# Patient Record
Sex: Female | Born: 1995 | Race: White | Smoking: Never smoker
Health system: Southern US, Community
[De-identification: ages and names within clinical notes are randomized; demographics above are authoritative.]

## PROBLEM LIST (undated history)

## (undated) DIAGNOSIS — L988 Other specified disorders of the skin and subcutaneous tissue: Secondary | ICD-10-CM

## (undated) DIAGNOSIS — E1065 Type 1 diabetes mellitus with hyperglycemia: Secondary | ICD-10-CM

## (undated) DIAGNOSIS — E109 Type 1 diabetes mellitus without complications: Secondary | ICD-10-CM

## (undated) HISTORY — PX: OTHER SURGICAL HISTORY: SHX169

---

## 2015-01-15 DIAGNOSIS — L988 Other specified disorders of the skin and subcutaneous tissue: Secondary | ICD-10-CM

## 2015-01-15 HISTORY — DX: Other specified disorders of the skin and subcutaneous tissue: L98.8

## 2015-05-03 ENCOUNTER — Ambulatory Visit: Payer: Self-pay | Admitting: *Deleted

## 2017-10-30 ENCOUNTER — Encounter (HOSPITAL_COMMUNITY): Payer: Self-pay

## 2017-10-30 ENCOUNTER — Other Ambulatory Visit: Payer: Self-pay

## 2017-10-30 ENCOUNTER — Emergency Department (HOSPITAL_COMMUNITY): Payer: 59

## 2017-10-30 ENCOUNTER — Inpatient Hospital Stay (HOSPITAL_COMMUNITY)
Admission: EM | Admit: 2017-10-30 | Discharge: 2017-11-03 | DRG: 343 | Disposition: A | Discharge status: Home / Self Care | Payer: 59 | Attending: General Surgery | Admitting: General Surgery

## 2017-10-30 DIAGNOSIS — Z8249 Family history of ischemic heart disease and other diseases of the circulatory system: Secondary | ICD-10-CM

## 2017-10-30 DIAGNOSIS — G9340 Encephalopathy, unspecified: Secondary | ICD-10-CM | POA: Diagnosis present

## 2017-10-30 DIAGNOSIS — N898 Other specified noninflammatory disorders of vagina: Secondary | ICD-10-CM | POA: Diagnosis present

## 2017-10-30 DIAGNOSIS — Z794 Long term (current) use of insulin: Secondary | ICD-10-CM | POA: Diagnosis not present

## 2017-10-30 DIAGNOSIS — Z79899 Other long term (current) drug therapy: Secondary | ICD-10-CM

## 2017-10-30 DIAGNOSIS — K3580 Unspecified acute appendicitis: Secondary | ICD-10-CM | POA: Insufficient documentation

## 2017-10-30 DIAGNOSIS — G4700 Insomnia, unspecified: Secondary | ICD-10-CM | POA: Diagnosis present

## 2017-10-30 DIAGNOSIS — E1065 Type 1 diabetes mellitus with hyperglycemia: Secondary | ICD-10-CM | POA: Diagnosis present

## 2017-10-30 DIAGNOSIS — R1031 Right lower quadrant pain: Secondary | ICD-10-CM | POA: Diagnosis present

## 2017-10-30 DIAGNOSIS — E109 Type 1 diabetes mellitus without complications: Secondary | ICD-10-CM | POA: Insufficient documentation

## 2017-10-30 DIAGNOSIS — Z8379 Family history of other diseases of the digestive system: Secondary | ICD-10-CM

## 2017-10-30 HISTORY — DX: Type 1 diabetes mellitus with hyperglycemia: E10.65

## 2017-10-30 HISTORY — DX: Other specified disorders of the skin and subcutaneous tissue: L98.8

## 2017-10-30 HISTORY — DX: Type 1 diabetes mellitus without complications: E10.9

## 2017-10-30 LAB — COMPREHENSIVE METABOLIC PANEL
ALT: 16 U/L (ref 0–44)
AST: 16 U/L (ref 15–41)
Albumin: 4.8 g/dL (ref 3.5–5.0)
Alkaline Phosphatase: 69 U/L (ref 38–126)
Anion gap: 13 (ref 5–15)
BILIRUBIN TOTAL: 1 mg/dL (ref 0.3–1.2)
BUN: 15 mg/dL (ref 6–20)
CHLORIDE: 102 mmol/L (ref 98–111)
CO2: 22 mmol/L (ref 22–32)
CREATININE: 0.78 mg/dL (ref 0.44–1.00)
Calcium: 9.6 mg/dL (ref 8.9–10.3)
Glucose, Bld: 207 mg/dL — ABNORMAL HIGH (ref 70–99)
POTASSIUM: 3.8 mmol/L (ref 3.5–5.1)
Sodium: 137 mmol/L (ref 135–145)
TOTAL PROTEIN: 8.4 g/dL — AB (ref 6.5–8.1)

## 2017-10-30 LAB — CBC WITH DIFFERENTIAL/PLATELET
Abs Immature Granulocytes: 0.01 10*3/uL (ref 0.00–0.07)
Basophils Absolute: 0 10*3/uL (ref 0.0–0.1)
Basophils Relative: 0 %
Eosinophils Absolute: 0 10*3/uL (ref 0.0–0.5)
Eosinophils Relative: 0 %
HCT: 48.2 % — ABNORMAL HIGH (ref 36.0–46.0)
Hemoglobin: 16 g/dL — ABNORMAL HIGH (ref 12.0–15.0)
Immature Granulocytes: 0 %
Lymphocytes Relative: 27 %
Lymphs Abs: 1.7 10*3/uL (ref 0.7–4.0)
MCH: 29.3 pg (ref 26.0–34.0)
MCHC: 33.2 g/dL (ref 30.0–36.0)
MCV: 88.3 fL (ref 80.0–100.0)
Monocytes Absolute: 0.3 10*3/uL (ref 0.1–1.0)
Monocytes Relative: 5 %
Neutro Abs: 4.2 10*3/uL (ref 1.7–7.7)
Neutrophils Relative %: 68 %
Platelets: 271 10*3/uL (ref 150–400)
RBC: 5.46 MIL/uL — ABNORMAL HIGH (ref 3.87–5.11)
RDW: 11.7 % (ref 11.5–15.5)
WBC: 6.2 10*3/uL (ref 4.0–10.5)
nRBC: 0 % (ref 0.0–0.2)

## 2017-10-30 LAB — I-STAT BETA HCG BLOOD, ED (MC, WL, AP ONLY): I-stat hCG, quantitative: <5 m[IU]/mL (ref <5)

## 2017-10-30 LAB — WET PREP, GENITAL
Sperm: NONE SEEN
Trich, Wet Prep: NONE SEEN
Yeast Wet Prep HPF POC: NONE SEEN

## 2017-10-30 LAB — URINALYSIS, ROUTINE W REFLEX MICROSCOPIC
BILIRUBIN URINE: NEGATIVE
Glucose, UA: 150 mg/dL — AB
HGB URINE DIPSTICK: NEGATIVE
Ketones, ur: 80 mg/dL — AB
Leukocytes, UA: NEGATIVE
NITRITE: NEGATIVE
PH: 5 (ref 5.0–8.0)
Protein, ur: 30 mg/dL — AB
SPECIFIC GRAVITY, URINE: 1.034 — AB (ref 1.005–1.030)

## 2017-10-30 LAB — CBG MONITORING, ED
Glucose-Capillary: 186 mg/dL — ABNORMAL HIGH (ref 70–99)
Glucose-Capillary: 109 mg/dL — ABNORMAL HIGH (ref 70–99)

## 2017-10-30 LAB — LIPASE, BLOOD: Lipase: 34 U/L (ref 11–51)

## 2017-10-30 LAB — GLUCOSE, CAPILLARY: Glucose-Capillary: 107 mg/dL — ABNORMAL HIGH (ref 70–99)

## 2017-10-30 MED ORDER — SODIUM CHLORIDE 0.9 % IV BOLUS: 1000.0000 mL | Freq: Three times a day (TID) | INTRAVENOUS | Status: DC | PRN

## 2017-10-30 MED ORDER — DIPHENHYDRAMINE HCL 12.5 MG/5ML PO ELIX
12.5000 mg | ORAL_SOLUTION | Freq: Four times a day (QID) | ORAL | Status: DC | PRN
  Administered 2017-11-01 – 2017-11-02 (×2): 12.5 mg via ORAL
  Filled 2017-10-30 (×2): qty 5

## 2017-10-30 MED ORDER — MORPHINE SULFATE (PF) 4 MG/ML IV SOLN
4.0000 mg | Freq: Once | INTRAVENOUS | Status: AC
  Administered 2017-10-30: 4 mg via INTRAVENOUS
  Filled 2017-10-30: qty 1

## 2017-10-30 MED ORDER — ENOXAPARIN SODIUM 40 MG/0.4ML ~~LOC~~ SOLN
40.0000 mg | Freq: Every day | SUBCUTANEOUS | Status: DC
  Administered 2017-10-30 – 2017-11-02 (×4): 40 mg via SUBCUTANEOUS
  Filled 2017-10-30 (×4): qty 0.4

## 2017-10-30 MED ORDER — SIMETHICONE 80 MG PO CHEW: 40.0000 mg | CHEWABLE_TABLET | Freq: Four times a day (QID) | ORAL | Status: DC | PRN

## 2017-10-30 MED ORDER — HYDROCORTISONE 1 % EX CREA: 1.0000 "application " | TOPICAL_CREAM | Freq: Three times a day (TID) | CUTANEOUS | Status: DC | PRN

## 2017-10-30 MED ORDER — ONDANSETRON 4 MG PO TBDP: 4.0000 mg | ORAL_TABLET | Freq: Four times a day (QID) | ORAL | Status: DC | PRN

## 2017-10-30 MED ORDER — METHOCARBAMOL 1000 MG/10ML IJ SOLN
1000.0000 mg | Freq: Four times a day (QID) | INTRAVENOUS | Status: DC | PRN
  Administered 2017-11-01: 1000 mg via INTRAVENOUS
  Filled 2017-10-30 (×2): qty 10

## 2017-10-30 MED ORDER — ACETAMINOPHEN 500 MG PO TABS: 1000.0000 mg | ORAL_TABLET | ORAL | Status: AC

## 2017-10-30 MED ORDER — MENTHOL 3 MG MT LOZG: 1.0000 | LOZENGE | OROMUCOSAL | Status: DC | PRN

## 2017-10-30 MED ORDER — LACTATED RINGERS IV SOLN
INTRAVENOUS | Status: DC
  Administered 2017-10-30 – 2017-11-03 (×5): via INTRAVENOUS

## 2017-10-30 MED ORDER — IOPAMIDOL (ISOVUE-300) INJECTION 61%
100.0000 mL | Freq: Once | INTRAVENOUS | Status: AC | PRN
  Administered 2017-10-30: 100 mL via INTRAVENOUS

## 2017-10-30 MED ORDER — ESCITALOPRAM OXALATE 10 MG PO TABS
5.0000 mg | ORAL_TABLET | Freq: Every day | ORAL | Status: DC
  Administered 2017-10-30 – 2017-11-03 (×4): 5 mg via ORAL
  Filled 2017-10-30 (×4): qty 1

## 2017-10-30 MED ORDER — ONDANSETRON HCL 4 MG/2ML IJ SOLN
4.0000 mg | Freq: Once | INTRAMUSCULAR | Status: AC
  Administered 2017-10-30: 4 mg via INTRAVENOUS
  Filled 2017-10-30: qty 2

## 2017-10-30 MED ORDER — SODIUM CHLORIDE 0.9 % IJ SOLN
INTRAMUSCULAR | Status: AC
  Filled 2017-10-30: qty 50

## 2017-10-30 MED ORDER — SODIUM CHLORIDE 0.9 % IV SOLN
2.0000 g | INTRAVENOUS | Status: DC
  Administered 2017-10-31: 2 g via INTRAVENOUS
  Filled 2017-10-30 (×2): qty 20
  Filled 2017-10-30: qty 2

## 2017-10-30 MED ORDER — SODIUM CHLORIDE 0.9 % IV BOLUS
1000.0000 mL | Freq: Once | INTRAVENOUS | Status: AC
  Administered 2017-10-30: 1000 mL via INTRAVENOUS

## 2017-10-30 MED ORDER — ONDANSETRON HCL 4 MG/2ML IJ SOLN: 4.0000 mg | Freq: Four times a day (QID) | INTRAMUSCULAR | Status: DC | PRN

## 2017-10-30 MED ORDER — LACTATED RINGERS IV BOLUS
1000.0000 mL | Freq: Once | INTRAVENOUS | Status: AC
  Administered 2017-10-30: 1000 mL via INTRAVENOUS

## 2017-10-30 MED ORDER — DIPHENHYDRAMINE HCL 50 MG/ML IJ SOLN: 12.5000 mg | Freq: Four times a day (QID) | INTRAMUSCULAR | Status: DC | PRN

## 2017-10-30 MED ORDER — INSULIN ASPART 100 UNIT/ML ~~LOC~~ SOLN
0.0000 [IU] | SUBCUTANEOUS | Status: DC
  Administered 2017-10-31: 9 [IU] via SUBCUTANEOUS
  Administered 2017-10-31 – 2017-11-01 (×4): 3 [IU] via SUBCUTANEOUS
  Administered 2017-11-02: 2 [IU] via SUBCUTANEOUS

## 2017-10-30 MED ORDER — GABAPENTIN 300 MG PO CAPS: 300.0000 mg | ORAL_CAPSULE | ORAL | Status: AC

## 2017-10-30 MED ORDER — ALUM & MAG HYDROXIDE-SIMETH 200-200-20 MG/5ML PO SUSP: 30.0000 mL | Freq: Four times a day (QID) | ORAL | Status: DC | PRN

## 2017-10-30 MED ORDER — KETOROLAC TROMETHAMINE 30 MG/ML IJ SOLN
30.0000 mg | Freq: Once | INTRAMUSCULAR | Status: AC
  Administered 2017-10-30: 30 mg via INTRAVENOUS
  Filled 2017-10-30: qty 1

## 2017-10-30 MED ORDER — HYDROCORTISONE 2.5 % RE CREA: 1.0000 "application " | TOPICAL_CREAM | Freq: Four times a day (QID) | RECTAL | Status: DC | PRN

## 2017-10-30 MED ORDER — ACETAMINOPHEN 650 MG RE SUPP: 650.0000 mg | Freq: Four times a day (QID) | RECTAL | Status: DC | PRN

## 2017-10-30 MED ORDER — TRAZODONE HCL 50 MG PO TABS
50.0000 mg | ORAL_TABLET | Freq: Every day | ORAL | Status: DC
  Administered 2017-10-30 – 2017-11-02 (×4): 50 mg via ORAL
  Filled 2017-10-30 (×4): qty 1

## 2017-10-30 MED ORDER — SODIUM CHLORIDE 0.9 % IV SOLN
8.0000 mg | Freq: Four times a day (QID) | INTRAVENOUS | Status: DC | PRN
  Filled 2017-10-30: qty 4

## 2017-10-30 MED ORDER — PROCHLORPERAZINE EDISYLATE 10 MG/2ML IJ SOLN: 5.0000 mg | INTRAMUSCULAR | Status: DC | PRN

## 2017-10-30 MED ORDER — GUAIFENESIN-DM 100-10 MG/5ML PO SYRP: 10.0000 mL | ORAL_SOLUTION | ORAL | Status: DC | PRN

## 2017-10-30 MED ORDER — IOPAMIDOL (ISOVUE-300) INJECTION 61%
INTRAVENOUS | Status: AC
  Filled 2017-10-30: qty 100

## 2017-10-30 MED ORDER — ACETAMINOPHEN 325 MG PO TABS
650.0000 mg | ORAL_TABLET | Freq: Four times a day (QID) | ORAL | Status: DC | PRN
  Administered 2017-11-01 – 2017-11-03 (×5): 650 mg via ORAL
  Filled 2017-10-30 (×5): qty 2

## 2017-10-30 MED ORDER — LIP MEDEX EX OINT
1.0000 "application " | TOPICAL_OINTMENT | Freq: Two times a day (BID) | CUTANEOUS | Status: DC
  Administered 2017-10-30 – 2017-11-02 (×5): 1 via TOPICAL
  Filled 2017-10-30 (×2): qty 7

## 2017-10-30 MED ORDER — ENSURE PRE-SURGERY PO LIQD
296.0000 mL | Freq: Once | ORAL | Status: AC
  Administered 2017-10-31: 296 mL via ORAL
  Filled 2017-10-30: qty 296

## 2017-10-30 MED ORDER — METRONIDAZOLE IN NACL 5-0.79 MG/ML-% IV SOLN
500.0000 mg | Freq: Three times a day (TID) | INTRAVENOUS | Status: DC
  Administered 2017-10-30 – 2017-10-31 (×2): 500 mg via INTRAVENOUS
  Filled 2017-10-30 (×3): qty 100

## 2017-10-30 MED ORDER — MAGIC MOUTHWASH
15.0000 mL | Freq: Four times a day (QID) | ORAL | Status: DC | PRN
  Filled 2017-10-30: qty 15

## 2017-10-30 MED ORDER — HYDROMORPHONE HCL 1 MG/ML IJ SOLN
0.5000 mg | INTRAMUSCULAR | Status: DC | PRN
  Administered 2017-10-31 (×2): 1 mg via INTRAVENOUS
  Administered 2017-10-31 – 2017-11-01 (×3): 2 mg via INTRAVENOUS
  Administered 2017-11-01: 1 mg via INTRAVENOUS
  Administered 2017-11-01: 2 mg via INTRAVENOUS
  Filled 2017-10-30: qty 1
  Filled 2017-10-30 (×2): qty 2
  Filled 2017-10-30 (×2): qty 1
  Filled 2017-10-30 (×2): qty 2

## 2017-10-30 MED ORDER — INSULIN GLARGINE 100 UNIT/ML ~~LOC~~ SOLN
15.0000 [IU] | Freq: Every day | SUBCUTANEOUS | Status: DC
  Administered 2017-10-31 – 2017-11-02 (×3): 15 [IU] via SUBCUTANEOUS
  Filled 2017-10-30 (×5): qty 0.15

## 2017-10-30 MED ORDER — CELECOXIB 200 MG PO CAPS: 200.0000 mg | ORAL_CAPSULE | ORAL | Status: AC

## 2017-10-30 MED ORDER — IOHEXOL 300 MG/ML  SOLN
30.0000 mL | Freq: Once | INTRAMUSCULAR | Status: AC | PRN
  Administered 2017-10-30: 30 mL via ORAL

## 2017-10-30 MED ORDER — PHENOL 1.4 % MT LIQD: 1.0000 | OROMUCOSAL | Status: DC | PRN

## 2017-10-30 NOTE — H&P (Signed)
Kelly Morrow  1995-06-21 [id number]  CARE TEAM:  PCP: Patient, No Pcp Per  Outpatient Care Team: Patient Care Team: Patient, No Pcp Per as PCP - General (General Practice)  Inpatient Treatment Team: Treatment Team: Attending Provider: Gareth Morgan, MD; Registered Nurse: Lupe Carney, RN; Physician Assistant: Debroah Baller; Registered Nurse: Stefanie Libel, RN; Technician: Francoise Schaumann, NT; Consulting Physician: Edison Pace, Md, MD   This patient is a 22 y.o.female who presents today for surgical evaluation at the request of Rodell Perna, [facility] ED.   Chief complaint / Reason for evaluation: Right lower quadrant abdominal pain.  Probable appendicitis.  Pleasant young female.  Is lived in [place] in [place].  Currently in [place].  Developed worsening abdominal pain over a day ago.  Rather diffuse with nausea and decreased appetite.  Became more intense.  Began vomiting.  Pain more intense and right lower side.  Painful to cough or move.  Car ride to the ER uncomfortable.  Examination concerning for appendicitis.  CT scan confirms large appendix.  No definite inflammation seen but extremely thin.  Rest of the differential diagnosis seems unlikely, surgical consultation requested.  She is an insulin requiring diabetic.  Followed by Dr. Buddy Duty.  On subcutaneous insulin.  Sugars tend to run in the 200s.  No personal nor family history of GI/colon cancer, inflammatory bowel disease, irritable bowel syndrome, allergy such as Celiac Sprue, dietary/dairy problems, colitis, ulcers nor gastritis.  No recent sick contacts/gastroenteritis.  No travel outside the country.  No changes in diet.  No dysphagia to solids or liquids.  No significant heartburn or reflux.  No hematochezia, hematemesis, coffee ground emesis.  No evidence of prior gastric/peptic ulceration.  Normally moves her bowels every day.  Does not smoke.  No prior abdominal surgery.  No history of kidney  stones.  No PID.  No vaginal bleeding or discharge.  No pyuria.  No recent urinary tract infection.    Assessment  Kelly Morrow  22 y.o. female       Problem List:  Principal Problem:   Abdominal pain, RLQ (right lower quadrant) Active Problems:   Poorly controlled type 1 diabetes mellitus ([place])   Acute appendicitis by history physical  Plan:  Admit.  IV antibiotics.  IV fluids.  Diabetic control.  Continue QHS long-term insulin.  Do sliding scale insulin for now.  Check A1c.  Diagnostic laparoscopy with probable appendectomy this admission. The anatomy & physiology of the digestive tract was discussed.  The pathophysiology of appendicitis and other appendiceal disorders were discussed.  Natural history risks without surgery was discussed.   I feel the risks of no intervention will lead to serious problems that outweigh the operative risks; therefore, I recommended diagnostic laparoscopy with removal of appendix to remove the pathology.  Laparoscopic & open techniques were discussed.   I noted a good likelihood this will help address the problem.   Risks such as bleeding, infection, abscess, leak, reoperation, injury to other organs, need for repair of tissues / organs, possible ostomy, hernia, heart attack, stroke, death, and other risks were discussed.  Goals of post-operative recovery were discussed as well.  We will work to minimize complications.  Questions were answered.  The patient expresses understanding & wishes to proceed with surgery.   -VTE prophylaxis- SCDs, etc -mobilize as tolerated to help recovery  30 minutes spent in review, evaluation, examination, counseling, and coordination of care.  More than 50% of that time was spent in  counseling.  Adin Hector, MD, FACS, MASCRS Gastrointestinal and Minimally Invasive Surgery  Acute appendicitis  [address], [place], [place] [address] [phone] Main / Paging [phone]  Fax   10/30/2017      Past Medical History:  Diagnosis Date  . Type 1 diabetes mellitus (HCC)    Type 1    Past Surgical History:  Procedure Laterality Date  . pilonidal cyst removal      Social History   Socioeconomic History  . Marital status: Unknown    Spouse name: Not on file  . Number of children: Not on file  . Years of education: Not on file  . Highest education level: Not on file  Occupational History  . Not on file  Social Needs  . Financial resource strain: Not on file  . Food insecurity:    Worry: Not on file    Inability: Not on file  . Transportation needs:    Medical: Not on file    Non-medical: Not on file  Tobacco Use  . Smoking status: Never Smoker  . Smokeless tobacco: Never Used  Substance and Sexual Activity  . Alcohol use: Never    Frequency: Never  . Drug use: Never  . Sexual activity: Not on file  Lifestyle  . Physical activity:    Days per week: Not on file    Minutes per session: Not on file  . Stress: Not on file  Relationships  . Social connections:    Talks on phone: Not on file    Gets together: Not on file    Attends religious service: Not on file    Active member of club or organization: Not on file    Attends meetings of clubs or organizations: Not on file    Relationship status: Not on file  . Intimate partner violence:    Fear of current or ex partner: Not on file    Emotionally abused: Not on file    Physically abused: Not on file    Forced sexual activity: Not on file  Other Topics Concern  . Not on file  Social History Narrative  . Not on file    Family History  Problem Relation Age of Onset  . Celiac disease Mother   . Hypertension Father     Current Facility-Administered Medications  Medication Dose Route Frequency Provider Last Rate Last Dose  . BASAGLAR KWIKPEN KwikPen 15 Units  15 Units Subcutaneous QHS Michael Boston, MD      . escitalopram (LEXAPRO) tablet 5 mg  5 mg Oral Daily Loretha Ure, Remo Lipps, MD       . insulin aspart (novoLOG) injection 0-15 Units  0-15 Units Subcutaneous Q4H Michael Boston, MD      . iopamidol (ISOVUE-300) 61 % injection           . sodium chloride 0.9 % injection           . traZODone (DESYREL) tablet 50 mg  50 mg Oral Ardeen Fillers, MD       Current Outpatient Medications  Medication Sig Dispense Refill  . escitalopram (LEXAPRO) 5 MG tablet Take 1 tablet by mouth daily.    . Insulin Glargine (BASAGLAR KWIKPEN) 100 UNIT/ML SOPN Inject 15 Units into the skin at bedtime.     Marland Kitchen NOVOLOG FLEXPEN 100 UNIT/ML FlexPen Inject 10-15 Units into the skin 3 (three) times daily with meals.     . traZODone (DESYREL) 50 MG tablet Take 1 tablet  by mouth at bedtime.     Marland Kitchen glucagon 1 MG injection Inject 1 mg into the vein once as needed.        No Known Allergies  ROS:   All other systems reviewed & are negative except per HPI or as noted below: Constitutional:  No fevers, chills, sweats.  Weight stable Eyes:  No vision changes, No discharge HENT:  No sore throats, nasal drainage Lymph: No neck swelling, No bruising easily Pulmonary:  No cough, productive sputum CV: No orthopnea, PND  Patient walks 60 minutes for about 2 miles without difficulty.  No exertional chest/neck/shoulder/arm pain. GI: No personal nor family history of GI/colon cancer, inflammatory bowel disease, irritable bowel syndrome, allergy such as Celiac Sprue, dietary/dairy problems, colitis, ulcers nor gastritis.  No recent sick contacts/gastroenteritis.  No travel outside the country.  No changes in diet. Renal: No UTIs, No hematuria Genital:  No drainage, bleeding, masses Musculoskeletal: No severe joint pain.  Good ROM major joints Skin:  No sores or lesions.  No rashes Heme/Lymph:  No easy bleeding.  No swollen lymph nodes Neuro: No focal weakness/numbness.  No seizures Psych: No suicidal ideation.  No hallucinations  BP 132/81 (BP Location: Left Arm)   Pulse 79   Temp 98.2 F (36.8 C)   Resp 14    Ht _0  (1.6 m)   Wt 53.5 kg   LMP 10/23/2017   SpO2 100%   BMI 20.90 kg/m   Physical Exam: General: Pt awake/alert/oriented x4 in mild major acute distress Eyes: PERRL, normal EOM. Sclera nonicteric Neuro: CN II-XII intact w/o focal sensory/motor deficits. Lymph: No head/neck/groin lymphadenopathy Psych:  No delerium/psychosis/paranoia HENT: Normocephalic, Mucus membranes moist.  No thrush Neck: Supple, No tracheal deviation Chest: No pain.  Good respiratory excursion. CV:  Pulses intact.  Regular rhythm  Abdomen: Soft, Nondistended.  Tenderness to palpation right lower quadrant with guarding.  Right over McBurney's point.  Positive psoas sign.  Positive Rosvig sign.  Left lower quadrant upper abdomen otherwise nontender.  Nontender.  No incarcerated hernias.  Gen:  No inguinal hernias.  No inguinal lymphadenopathy.  No vaginal bleeding or discharge.  I did not repeat pelvic examination. Rectal deferred. Ext:  SCDs BLE.  No significant edema.  No cyanosis Skin: No petechiae / purpurea.  No major sores Musculoskeletal: No severe joint pain.  Good ROM major joints   Results:   Labs: Results for orders placed or performed during the hospital encounter of 10/30/17 (from the past 48 hour(s))  Lipase, blood     Status: None   Collection Time: 10/30/17  3:15 PM  Result Value Ref Range   Lipase 34 11 - 51 U/L    Comment: Performed at [facility], [place] [address], [place], [place] [address]  Comprehensive metabolic panel     Status: Abnormal   Collection Time: 10/30/17  3:15 PM  Result Value Ref Range   Sodium 137 135 - 145 mmol/L   Potassium 3.8 3.5 - 5.1 mmol/L   Chloride 102 98 - 111 mmol/L   CO2 22 22 - 32 mmol/L   Glucose, Bld 207 (H) 70 - 99 mg/dL   BUN 15 6 - 20 mg/dL   Creatinine, Ser 0.78 0.44 - 1.00 mg/dL   Calcium 9.6 8.9 - 10.3 mg/dL   Total Protein 8.4 (H) 6.5 - 8.1 g/dL   Albumin 4.8 3.5 - 5.0 g/dL   AST 16 15 - 41 U/L   ALT 16 0 - 44  U/L    Alkaline Phosphatase 69 38 - 126 U/L   Total Bilirubin 1.0 0.3 - 1.2 mg/dL   GFR calc non Af Amer >60 >60 mL/min   GFR calc Af Amer >60 >60 mL/min    Comment: (NOTE) The eGFR has been calculated using the CKD EPI equation. This calculation has not been validated in all clinical situations. eGFR's persistently <60 mL/min signify possible Chronic Kidney Disease.    Anion gap 13 5 - 15    Comment: Performed at [facility], [place] [address] [place], [place] [address]  Urinalysis, Routine w reflex microscopic     Status: Abnormal   Collection Time: 10/30/17  3:15 PM  Result Value Ref Range   Color, Urine AMBER (A) YELLOW    Comment: BIOCHEMICALS MAY BE AFFECTED BY COLOR   APPearance HAZY (A) CLEAR   Specific Gravity, Urine 1.034 (H) 1.005 - 1.030   pH 5.0 5.0 - 8.0   Glucose, UA 150 (A) NEGATIVE mg/dL   Hgb urine dipstick NEGATIVE NEGATIVE   Bilirubin Urine NEGATIVE NEGATIVE   Ketones, ur 80 (A) NEGATIVE mg/dL   Protein, ur 30 (A) NEGATIVE mg/dL   Nitrite NEGATIVE NEGATIVE   Leukocytes, UA NEGATIVE NEGATIVE   RBC / HPF 6-10 0 - 5 RBC/hpf   WBC, UA 0-5 0 - 5 WBC/hpf   Bacteria, UA RARE (A) NONE SEEN   Squamous Epithelial / LPF 0-5 0 - 5   Mucus PRESENT    Hyaline Casts, UA PRESENT     Comment: Performed at [facility], [place] [address], [place], [place] [address]  CBC with Differential     Status: Abnormal   Collection Time: 10/30/17  5:20 PM  Result Value Ref Range   WBC 6.2 4.0 - 10.5 K/uL   RBC 5.46 (H) 3.87 - 5.11 MIL/uL   Hemoglobin 16.0 (H) 12.0 - 15.0 g/dL   HCT 48.2 (H) 36.0 - 46.0 %   MCV 88.3 80.0 - 100.0 fL   MCH 29.3 26.0 - 34.0 pg   MCHC 33.2 30.0 - 36.0 g/dL   RDW 11.7 11.5 - 15.5 %   Platelets 271 150 - 400 K/uL   nRBC 0.0 0.0 - 0.2 %   Neutrophils Relative % 68 %   Neutro Abs 4.2 1.7 - 7.7 K/uL   Lymphocytes Relative 27 %   Lymphs Abs 1.7 0.7 - 4.0 K/uL   Monocytes Relative 5 %   Monocytes Absolute 0.3 0.1 - 1.0  K/uL   Eosinophils Relative 0 %   Eosinophils Absolute 0.0 0.0 - 0.5 K/uL   Basophils Relative 0 %   Basophils Absolute 0.0 0.0 - 0.1 K/uL   Immature Granulocytes 0 %   Abs Immature Granulocytes 0.01 0.00 - 0.07 K/uL    Comment: Performed at [facility], [place] [address], [place], [place] [address]  CBG monitoring, ED     Status: Abnormal   Collection Time: 10/30/17  5:20 PM  Result Value Ref Range   Glucose-Capillary 186 (H) 70 - 99 mg/dL  I-Stat beta hCG blood, ED     Status: None   Collection Time: 10/30/17  5:55 PM  Result Value Ref Range   I-stat hCG, quantitative <5.0 <5 mIU/mL   Comment 3            Comment:   GEST. AGE      CONC.  (mIU/mL)   <=1 WEEK        5 -  50     2 WEEKS       50 - 500     3 WEEKS       100 - 10,000     4 WEEKS     1,000 - 30,000        FEMALE AND NON-PREGNANT FEMALE:     LESS THAN 5 mIU/mL   Wet prep, genital     Status: Abnormal   Collection Time: 10/30/17  6:15 PM  Result Value Ref Range   Yeast Wet Prep HPF POC NONE SEEN NONE SEEN   Trich, Wet Prep NONE SEEN NONE SEEN   Clue Cells Wet Prep HPF POC PRESENT (A) NONE SEEN   WBC, Wet Prep HPF POC FEW (A) NONE SEEN   Sperm NONE SEEN     Comment: Performed at [facility], [place] [address] [place], [place] [address]    Imaging / Studies: Ct Abdomen Pelvis W Contrast  Result Date: 10/30/2017 CLINICAL DATA:  Right lower quadrant pain beginning yesterday. Type 1 diabetes. EXAM: CT ABDOMEN AND PELVIS WITH CONTRAST TECHNIQUE: Multidetector CT imaging of the abdomen and pelvis was performed using the standard protocol following bolus administration of intravenous contrast. CONTRAST:  129m ISOVUE-300 IOPAMIDOL (ISOVUE-300) INJECTION 61%, 375mOMNIPAQUE IOHEXOL 300 MG/ML SOLN COMPARISON:  None. FINDINGS: Lower chest: Normal Hepatobiliary: Normal Pancreas: Normal Spleen: Normal Adrenals/Urinary Tract: Adrenal glands are normal. Kidneys are normal. Bladder is normal.  Stomach/Bowel: The appendix is more prominent than usual, measuring up to 1 cm in diameter. There is not any convincing inflammatory change around the fat however. I note that the white count is also normal. Given the presence of right lower quadrant pain and appendix of this size, this could be a case of early appendicitis. I do not think the diagnosis is definite however. Vascular/Lymphatic: Normal Reproductive: Normal Other: None Musculoskeletal: Normal IMPRESSION: The appendix is more prominent than usual, measuring up to 1 cm in size. It does not fill with contrast. There is not any Jaylnn Ullery surrounding inflammatory change however. The patient does have right lower quadrant pain. The white count is normal. This case remains indeterminate by imaging, and the possibility of very early appendicitis is not excluded. At the least, clinical follow-up is suggested. Electronically Signed   By: MaNelson Chimes.D.   On: 10/30/2017 20:15    Medications / Allergies: per chart  Antibiotics: Anti-infectives (From admission, onward)   None        Note: Portions of this report may have been transcribed using voice recognition software. Every effort was made to ensure accuracy; however, inadvertent computerized transcription errors may be present.   Any transcriptional errors that result from this process are unintentional.    StAdin HectorMD, FACS, MASCRS Gastrointestinal and Minimally Invasive Surgery    [address] [place] [phone] / Paging [phone]   10/30/2017

## 2017-10-30 NOTE — ED Provider Notes (Signed)
[facility]-EMERGENCY DEPT Provider Note   CSN: [id number] Arrival date & time: 10/30/17  1456     History   Chief Complaint Chief Complaint  Patient presents with  . Abdominal Pain    HPI Kelly Morrow is a 22 y.o. female with history of type 1 diabetes mellitus presents for evaluation of acute onset, progressively worsening abdominal pain since yesterday.  Pain began at around 5:30 PM in the periumbilical region and has since radiated to the right lower quadrant.  Pain is constant, throbbing, at times sharp and will radiate "all over ".  Pain will worsen with bending and palpation.  She denies fevers or chills.  She endorses nausea and has had 2 episodes of nonbloody nonbilious emesis.  Attempted to take ibuprofen but vomited it back up.  Has not been able to tolerate p.o. intake since yesterday.  Denies urinary symptoms.  No vaginal itching, bleeding, or discharge.  Has had multiple episodes of watery nonbloody diarrhea.  Denies recent treatment with antibiotics, recent travel, or suspicious food intake.  The history is provided by the patient.    Past Medical History:  Diagnosis Date  . Pilonidal disease 2017  . Poorly controlled type 1 diabetes mellitus (HCC) 10/30/2017  . Type 1 diabetes mellitus (HCC)    Type 1    Patient Active Problem List   Diagnosis Date Noted  . Poorly controlled type 1 diabetes mellitus (HCC) 10/30/2017  . Acute appendicitis 10/30/2017  . Insomnia 10/30/2017    Past Surgical History:  Procedure Laterality Date  . pilonidal cyst removal       OB History   None      Home Medications    Prior to Admission medications   Medication Sig Start Date End Date Taking? Authorizing Provider  escitalopram (LEXAPRO) 5 MG tablet Take 1 tablet by mouth daily. 07/29/17  Yes [provider]  Insulin Glargine (BASAGLAR KWIKPEN) 100 UNIT/ML SOPN Inject 15 Units into the skin at bedtime.  10/05/17  Yes [provider]   NOVOLOG FLEXPEN 100 UNIT/ML FlexPen Inject 10-15 Units into the skin 3 (three) times daily with meals.  08/24/17  Yes [provider]  traZODone (DESYREL) 50 MG tablet Take 1 tablet by mouth at bedtime.  07/29/17  Yes [provider]  glucagon 1 MG injection Inject 1 mg into the vein once as needed.     [provider]    Family History Family History  Problem Relation Age of Onset  . Celiac disease Mother   . Hypertension Father     Social History Social History   Tobacco Use  . Smoking status: Never Smoker  . Smokeless tobacco: Never Used  Substance Use Topics  . Alcohol use: Never    Frequency: Never  . Drug use: Never     Allergies   Patient has no known allergies.   Review of Systems Review of Systems  Constitutional: Negative for chills and fever.  Respiratory: Negative for shortness of breath.   Cardiovascular: Negative for chest pain.  Gastrointestinal: Positive for abdominal pain, diarrhea, nausea and vomiting. Negative for blood in stool and constipation.  Genitourinary: Negative for dysuria, hematuria, urgency, vaginal bleeding and vaginal pain.  All other systems reviewed and are negative.    Physical Exam Updated Vital Signs BP (!) 133/93 (BP Location: Right Arm)   Pulse 74   Temp 98.2 F (36.8 C)   Resp 15   Ht 5\' 3"  (1.6 m)   Wt 53.5  kg   LMP 10/23/2017   SpO2 100%   BMI 20.90 kg/m   Physical Exam  Constitutional: She appears well-developed and well-nourished. No distress.  HENT:  Head: Normocephalic and atraumatic.  Eyes: Conjunctivae are normal. Right eye exhibits no discharge. Left eye exhibits no discharge.  Neck: No JVD present. No tracheal deviation present.  Cardiovascular: Normal rate, regular rhythm and normal heart sounds.  Pulmonary/Chest: Effort normal and breath sounds normal.  Abdominal: Soft. Bowel sounds are normal. She exhibits no distension. There is generalized tenderness and tenderness in the  right lower quadrant. There is guarding and tenderness at McBurney's point. There is no rigidity, no rebound, no CVA tenderness and negative Murphy's sign.  Generalized tenderness to palpation, maximally tender in the right lower quadrant.  Psoas sign present, Rovsing's sign present, tenderness to palpation at McBurney's point noted  Genitourinary: Cervix exhibits discharge and friability. Cervix exhibits no motion tenderness. Right adnexum displays no mass and no tenderness. Left adnexum displays no mass and no tenderness. Vaginal discharge found.  Genitourinary Comments: Examination performed in the presence of a chaperone.  No masses or lesions to the external genital.  Moderate amount of yellow-clear discharge in the vaginal vault.  Cervix mildly friable.  No cervical motion tenderness or adnexal tenderness.  Musculoskeletal: She exhibits no edema.  Neurological: She is alert.  Skin: Skin is warm and dry. No erythema.  Psychiatric: She has a normal mood and affect. Her behavior is normal.  Nursing note and vitals reviewed.    ED Treatments / Results  Labs (all labs ordered are listed, but only abnormal results are displayed) Labs Reviewed  WET PREP, GENITAL - Abnormal; Notable for the following components:      Result Value   Clue Cells Wet Prep HPF POC PRESENT (*)    WBC, Wet Prep HPF POC FEW (*)    All other components within normal limits  COMPREHENSIVE METABOLIC PANEL - Abnormal; Notable for the following components:   Glucose, Bld 207 (*)    Total Protein 8.4 (*)    All other components within normal limits  URINALYSIS, ROUTINE W REFLEX MICROSCOPIC - Abnormal; Notable for the following components:   Color, Urine AMBER (*)    APPearance HAZY (*)    Specific Gravity, Urine 1.034 (*)    Glucose, UA 150 (*)    Ketones, ur 80 (*)    Protein, ur 30 (*)    Bacteria, UA RARE (*)    All other components within normal limits  CBC WITH DIFFERENTIAL/PLATELET - Abnormal; Notable for the  following components:   RBC 5.46 (*)    Hemoglobin 16.0 (*)    HCT 48.2 (*)    All other components within normal limits  CBG MONITORING, ED - Abnormal; Notable for the following components:   Glucose-Capillary 186 (*)    All other components within normal limits  CBG MONITORING, ED - Abnormal; Notable for the following components:   Glucose-Capillary 109 (*)    All other components within normal limits  LIPASE, BLOOD  HIV ANTIBODY (ROUTINE TESTING W REFLEX)  RPR  HEMOGLOBIN A1C  I-STAT BETA HCG BLOOD, ED ([facility], [facility], [facility] ONLY)  GC/CHLAMYDIA PROBE AMP ([place]) NOT AT [facility]    EKG None  Radiology Ct Abdomen Pelvis W Contrast  Result Date: 10/30/2017 CLINICAL DATA:  Right lower quadrant pain beginning yesterday. Type 1 diabetes. EXAM: CT ABDOMEN AND PELVIS WITH CONTRAST TECHNIQUE: Multidetector CT imaging of the abdomen and pelvis was performed using the  standard protocol following bolus administration of intravenous contrast. CONTRAST:  ISOVUE-300 IOPAMIDOL (ISOVUE-300) INJECTION 61%, 30mL OMNIPAQUE IOHEXOL 300 MG/ML SOLN COMPARISON:  None. FINDINGS: Lower chest: Normal Hepatobiliary: Normal Pancreas: Normal Spleen: Normal Adrenals/Urinary Tract: Adrenal glands are normal. Kidneys are normal. Bladder is normal. Stomach/Bowel: The appendix is more prominent than usual, measuring up to 1 cm in diameter. There is not any convincing inflammatory change around the fat however. I note that the white count is also normal. Given the presence of right lower quadrant pain and appendix of this size, this could be a case of early appendicitis. I do not think the diagnosis is definite however. Vascular/Lymphatic: Normal Reproductive: Normal Other: None Musculoskeletal: Normal IMPRESSION: The appendix is more prominent than usual, measuring up to 1 cm in size. It does not fill with contrast. There is not any gross surrounding inflammatory change however. The patient does have right lower quadrant  pain. The white count is normal. This case remains indeterminate by imaging, and the possibility of very early appendicitis is not excluded. At the least, clinical follow-up is suggested. Electronically Signed   By: Paulina Fusi M.D.   On: 10/30/2017 20:15    Procedures Procedures (including critical care time)  Medications Ordered in ED Medications  iopamidol (ISOVUE-300) 61 % injection (has no administration in time range)  sodium chloride 0.9 % injection (has no administration in time range)  insulin aspart (novoLOG) injection 0-15 Units (0 Units Subcutaneous Not Given 10/30/17 2154)  BASAGLAR KWIKPEN KwikPen 15 Units (has no administration in time range)  traZODone (DESYREL) tablet 50 mg (has no administration in time range)  escitalopram (LEXAPRO) tablet 5 mg (has no administration in time range)  enoxaparin (LOVENOX) injection 40 mg (has no administration in time range)  lactated ringers infusion (has no administration in time range)  acetaminophen (TYLENOL) tablet 650 mg (has no administration in time range)    Or  acetaminophen (TYLENOL) suppository 650 mg (has no administration in time range)  diphenhydrAMINE (BENADRYL) 12.5 MG/5ML elixir 12.5 mg (has no administration in time range)    Or  diphenhydrAMINE (BENADRYL) injection 12.5 mg (has no administration in time range)  ondansetron (ZOFRAN-ODT) disintegrating tablet 4 mg (has no administration in time range)    Or  ondansetron (ZOFRAN) injection 4 mg (has no administration in time range)  simethicone (MYLICON) chewable tablet 40 mg (has no administration in time range)  cefTRIAXone (ROCEPHIN) 2 g in sodium chloride 0.9 % 100 mL IVPB (has no administration in time range)    And  metroNIDAZOLE (FLAGYL) IVPB 500 mg (has no administration in time range)  lactated ringers bolus 1,000 mL (has no administration in time range)  sodium chloride 0.9 % bolus 1,000 mL (has no administration in time range)  HYDROmorphone (DILAUDID)  injection 0.5-2 mg (has no administration in time range)  methocarbamol (ROBAXIN) 1,000 mg in dextrose 5 % 50 mL IVPB (has no administration in time range)  ondansetron (ZOFRAN) injection 4 mg (has no administration in time range)    Or  ondansetron (ZOFRAN) 8 mg in sodium chloride 0.9 % 50 mL IVPB (has no administration in time range)  prochlorperazine (COMPAZINE) injection 5-10 mg (has no administration in time range)  lip balm (CARMEX) ointment 1 application (has no administration in time range)  magic mouthwash (has no administration in time range)  guaiFENesin-dextromethorphan (ROBITUSSIN DM) 100-10 MG/5ML syrup 10 mL (has no administration in time range)  hydrocortisone (ANUSOL-HC) 2.5 % rectal cream 1 application (has no administration  in time range)  alum & mag hydroxide-simeth (MAALOX/MYLANTA) 200-200-20 MG/5ML suspension 30 mL (has no administration in time range)  hydrocortisone cream 1 % 1 application (has no administration in time range)  menthol-cetylpyridinium (CEPACOL) lozenge 3 mg (has no administration in time range)  phenol (CHLORASEPTIC) mouth spray 1-2 spray (has no administration in time range)  feeding supplement (ENSURE PRE-SURGERY) liquid 296 mL (has no administration in time range)  gabapentin (NEURONTIN) capsule 300 mg (has no administration in time range)  acetaminophen (TYLENOL) tablet 1,000 mg (has no administration in time range)  celecoxib (CELEBREX) capsule 200 mg (has no administration in time range)  sodium chloride 0.9 % bolus 1,000 mL (0 mLs Intravenous Stopped 10/30/17 1900)  morphine 4 MG/ML injection 4 mg (4 mg Intravenous Given 10/30/17 1755)  ondansetron (ZOFRAN) injection 4 mg (4 mg Intravenous Given 10/30/17 1743)  iohexol (OMNIPAQUE) 300 MG/ML solution 30 mL (30 mLs Oral Contrast Given 10/30/17 1745)  iopamidol (ISOVUE-300) 61 % injection 100 mL (100 mLs Intravenous Contrast Given 10/30/17 1947)  ketorolac (TORADOL) 30 MG/ML injection 30 mg (30 mg  Intravenous Given 10/30/17 2039)     Initial Impression / Assessment and Plan / ED Course  I have reviewed the triage vital signs and the nursing notes.  Pertinent labs & imaging results that were available during my care of the patient were reviewed by me and considered in my medical decision making (see chart for details).     Patient with right lower quadrant abdominal pain, nausea, vomiting, anorexia since yesterday.  She is afebrile, vital signs are stable.  She is nontoxic in appearance.  Lab work reviewed by me shows no leukocytosis, significant for hyperglycemia.  LFTs, lipase, and creatinine within normal limits.  UA does not suggest UTI or nephrolithiasis but is consistent with dehydration.  No elevated anion gap, does not appear to be in DKA.  CT abdomen and pelvis shows enlarged appendix with no gross surrounding inflammatory changes though the patient is quite thin.  Spoke with Dr. Michaell Cowing with general surgery who agrees to assume care of patient and bring her into the hospital for further evaluation and management.  Pain managed in the ED.  Plan for laparoscopic appendectomy tomorrow.  Final Clinical Impressions(s) / ED Diagnoses   Final diagnoses:  Acute appendicitis, uncomplicated    ED Discharge Orders    None       Bennye Alm 10/30/17 2242    Alvira Monday, MD 11/02/17 1208

## 2017-10-30 NOTE — ED Notes (Signed)
Pt has visible shivering throughout the body that began today. Pt does not report feeling cold, or hot. Temp is 98.2. Pt does not report any associated factors, just says it repeatedly comes and goes.

## 2017-10-30 NOTE — ED Triage Notes (Signed)
Patient c/o RLQ pain since last PM and vomiting x 3 last being at 0400 today.

## 2017-10-31 ENCOUNTER — Inpatient Hospital Stay (HOSPITAL_COMMUNITY): Payer: 59 | Admitting: Anesthesiology

## 2017-10-31 ENCOUNTER — Encounter (HOSPITAL_COMMUNITY)
Admission: EM | Disposition: A | Discharge status: Home / Self Care | Payer: Self-pay | Source: Home / Self Care | Attending: Surgery

## 2017-10-31 HISTORY — PX: LAPAROSCOPIC APPENDECTOMY: SHX408

## 2017-10-31 LAB — GC/CHLAMYDIA PROBE AMP (~~LOC~~) NOT AT ARMC
Chlamydia: NEGATIVE
Neisseria Gonorrhea: NEGATIVE

## 2017-10-31 LAB — GLUCOSE, CAPILLARY
GLUCOSE-CAPILLARY: 149 mg/dL — AB (ref 70–99)
GLUCOSE-CAPILLARY: 156 mg/dL — AB (ref 70–99)
GLUCOSE-CAPILLARY: 170 mg/dL — AB (ref 70–99)
Glucose-Capillary: 184 mg/dL — ABNORMAL HIGH (ref 70–99)
Glucose-Capillary: 232 mg/dL — ABNORMAL HIGH (ref 70–99)
Glucose-Capillary: 244 mg/dL — ABNORMAL HIGH (ref 70–99)
Glucose-Capillary: 79 mg/dL (ref 70–99)

## 2017-10-31 LAB — MRSA PCR SCREENING: MRSA by PCR: NEGATIVE

## 2017-10-31 LAB — HIV ANTIBODY (ROUTINE TESTING W REFLEX): HIV SCREEN 4TH GENERATION: NONREACTIVE

## 2017-10-31 LAB — RPR: RPR: NONREACTIVE

## 2017-10-31 LAB — HEMOGLOBIN A1C
Hgb A1c MFr Bld: 10.2 % — ABNORMAL HIGH (ref 4.8–5.6)
MEAN PLASMA GLUCOSE: 246.04 mg/dL

## 2017-10-31 SURGERY — APPENDECTOMY, LAPAROSCOPIC
Anesthesia: General | Site: Abdomen

## 2017-10-31 MED ORDER — SUCCINYLCHOLINE CHLORIDE 200 MG/10ML IV SOSY
PREFILLED_SYRINGE | INTRAVENOUS | Status: AC
  Filled 2017-10-31: qty 10

## 2017-10-31 MED ORDER — FENTANYL CITRATE (PF) 250 MCG/5ML IJ SOLN
INTRAMUSCULAR | Status: AC
  Filled 2017-10-31: qty 5

## 2017-10-31 MED ORDER — SCOPOLAMINE 1 MG/3DAYS TD PT72
MEDICATED_PATCH | TRANSDERMAL | Status: AC
  Administered 2017-10-31: 1.5 mg via TRANSDERMAL
  Filled 2017-10-31: qty 1

## 2017-10-31 MED ORDER — SCOPOLAMINE 1 MG/3DAYS TD PT72
1.0000 | MEDICATED_PATCH | TRANSDERMAL | Status: DC
  Administered 2017-10-31: 1.5 mg via TRANSDERMAL

## 2017-10-31 MED ORDER — RINGERS IRRIGATION IR SOLN
Status: DC | PRN
  Administered 2017-10-31: 1

## 2017-10-31 MED ORDER — BUPIVACAINE HCL (PF) 0.25 % IJ SOLN
INTRAMUSCULAR | Status: AC
  Filled 2017-10-31: qty 30

## 2017-10-31 MED ORDER — ONDANSETRON HCL 4 MG/2ML IJ SOLN
INTRAMUSCULAR | Status: DC | PRN
  Administered 2017-10-31: 4 mg via INTRAVENOUS

## 2017-10-31 MED ORDER — SUCCINYLCHOLINE CHLORIDE 200 MG/10ML IV SOSY
PREFILLED_SYRINGE | INTRAVENOUS | Status: DC | PRN
  Administered 2017-10-31: 100 mg via INTRAVENOUS

## 2017-10-31 MED ORDER — OXYCODONE HCL 5 MG/5ML PO SOLN
5.0000 mg | Freq: Once | ORAL | Status: DC | PRN
  Filled 2017-10-31: qty 5

## 2017-10-31 MED ORDER — ROCURONIUM BROMIDE 10 MG/ML (PF) SYRINGE
PREFILLED_SYRINGE | INTRAVENOUS | Status: DC | PRN
  Administered 2017-10-31: 30 mg via INTRAVENOUS

## 2017-10-31 MED ORDER — MIDAZOLAM HCL 2 MG/2ML IJ SOLN
INTRAMUSCULAR | Status: AC
  Filled 2017-10-31: qty 2

## 2017-10-31 MED ORDER — PROMETHAZINE HCL 25 MG/ML IJ SOLN: 6.2500 mg | INTRAMUSCULAR | Status: DC | PRN

## 2017-10-31 MED ORDER — PROPOFOL 10 MG/ML IV BOLUS
INTRAVENOUS | Status: DC | PRN
  Administered 2017-10-31: 150 mg via INTRAVENOUS

## 2017-10-31 MED ORDER — FENTANYL CITRATE (PF) 100 MCG/2ML IJ SOLN
INTRAMUSCULAR | Status: AC
  Filled 2017-10-31: qty 2

## 2017-10-31 MED ORDER — SUGAMMADEX SODIUM 200 MG/2ML IV SOLN
INTRAVENOUS | Status: AC
  Filled 2017-10-31: qty 2

## 2017-10-31 MED ORDER — FENTANYL CITRATE (PF) 250 MCG/5ML IJ SOLN
INTRAMUSCULAR | Status: DC | PRN
  Administered 2017-10-31: 100 ug via INTRAVENOUS
  Administered 2017-10-31 (×3): 50 ug via INTRAVENOUS

## 2017-10-31 MED ORDER — LIDOCAINE 2% (20 MG/ML) 5 ML SYRINGE
INTRAMUSCULAR | Status: AC
  Filled 2017-10-31: qty 5

## 2017-10-31 MED ORDER — SODIUM CHLORIDE 0.9 % IR SOLN
Status: DC | PRN
  Administered 2017-10-31: 1000 mL

## 2017-10-31 MED ORDER — DEXAMETHASONE SODIUM PHOSPHATE 10 MG/ML IJ SOLN
INTRAMUSCULAR | Status: DC | PRN
  Administered 2017-10-31: 4 mg via INTRAVENOUS

## 2017-10-31 MED ORDER — MIDAZOLAM HCL 5 MG/5ML IJ SOLN
INTRAMUSCULAR | Status: DC | PRN
  Administered 2017-10-31: 2 mg via INTRAVENOUS

## 2017-10-31 MED ORDER — FENTANYL CITRATE (PF) 100 MCG/2ML IJ SOLN: 25.0000 ug | INTRAMUSCULAR | Status: DC | PRN

## 2017-10-31 MED ORDER — ROCURONIUM BROMIDE 10 MG/ML (PF) SYRINGE: PREFILLED_SYRINGE | INTRAVENOUS | Status: DC | PRN

## 2017-10-31 MED ORDER — KETOROLAC TROMETHAMINE 30 MG/ML IJ SOLN
INTRAMUSCULAR | Status: AC
  Filled 2017-10-31: qty 1

## 2017-10-31 MED ORDER — ONDANSETRON HCL 4 MG/2ML IJ SOLN
INTRAMUSCULAR | Status: AC
  Filled 2017-10-31: qty 2

## 2017-10-31 MED ORDER — OXYCODONE HCL 5 MG PO TABS: 5.0000 mg | ORAL_TABLET | Freq: Once | ORAL | Status: DC | PRN

## 2017-10-31 MED ORDER — ROCURONIUM BROMIDE 10 MG/ML (PF) SYRINGE
PREFILLED_SYRINGE | INTRAVENOUS | Status: AC
  Filled 2017-10-31: qty 10

## 2017-10-31 MED ORDER — PROPOFOL 10 MG/ML IV BOLUS
INTRAVENOUS | Status: AC
  Filled 2017-10-31: qty 20

## 2017-10-31 MED ORDER — LIDOCAINE 2% (20 MG/ML) 5 ML SYRINGE
INTRAMUSCULAR | Status: DC | PRN
  Administered 2017-10-31: 60 mg via INTRAVENOUS

## 2017-10-31 MED ORDER — BUPIVACAINE HCL 0.25 % IJ SOLN
INTRAMUSCULAR | Status: DC | PRN
  Administered 2017-10-31: 22 mL

## 2017-10-31 MED ORDER — NALOXONE HCL 0.4 MG/ML IJ SOLN
INTRAMUSCULAR | Status: AC
  Filled 2017-10-31: qty 1

## 2017-10-31 MED ORDER — SUGAMMADEX SODIUM 200 MG/2ML IV SOLN
INTRAVENOUS | Status: DC | PRN
  Administered 2017-10-31: 200 mg via INTRAVENOUS

## 2017-10-31 MED ORDER — KETOROLAC TROMETHAMINE 30 MG/ML IJ SOLN
INTRAMUSCULAR | Status: DC | PRN
  Administered 2017-10-31: 30 mg via INTRAVENOUS

## 2017-10-31 MED ORDER — DEXAMETHASONE SODIUM PHOSPHATE 10 MG/ML IJ SOLN
INTRAMUSCULAR | Status: AC
  Filled 2017-10-31: qty 1

## 2017-10-31 SURGICAL SUPPLY — 40 items
APPLIER CLIP 5 13 M/L LIGAMAX5 (MISCELLANEOUS)
APPLIER CLIP ROT 10 11.4 M/L (STAPLE)
BANDAGE ADH SHEER 1  50/CT (GAUZE/BANDAGES/DRESSINGS) ×9 IMPLANT
BENZOIN TINCTURE PRP APPL 2/3 (GAUZE/BANDAGES/DRESSINGS) ×6 IMPLANT
CABLE HIGH FREQUENCY MONO STRZ (ELECTRODE) ×3 IMPLANT
CLIP APPLIE 5 13 M/L LIGAMAX5 (MISCELLANEOUS) IMPLANT
CLIP APPLIE ROT 10 11.4 M/L (STAPLE) IMPLANT
CLIP VESOLOCK XL 6/CT (CLIP) ×3 IMPLANT
CLOSURE STERI-STRIP 1/4X4 (GAUZE/BANDAGES/DRESSINGS) ×3 IMPLANT
CLOSURE WOUND 1/2 X4 (GAUZE/BANDAGES/DRESSINGS) ×1
COVER SURGICAL LIGHT HANDLE (MISCELLANEOUS) ×3 IMPLANT
COVER WAND RF STERILE (DRAPES) ×3 IMPLANT
DECANTER SPIKE VIAL GLASS SM (MISCELLANEOUS) IMPLANT
DRAIN CHANNEL 19F RND (DRAIN) IMPLANT
DRAPE LAPAROSCOPIC ABDOMINAL (DRAPES) ×3 IMPLANT
ELECT REM PT RETURN 15FT ADLT (MISCELLANEOUS) ×3 IMPLANT
ENDOLOOP SUT PDS II  0 18 (SUTURE)
ENDOLOOP SUT PDS II 0 18 (SUTURE) IMPLANT
EVACUATOR SILICONE 100CC (DRAIN) IMPLANT
GLOVE BIOGEL PI IND STRL 7.0 (GLOVE) ×1 IMPLANT
GLOVE BIOGEL PI INDICATOR 7.0 (GLOVE) ×2
GLOVE SURG SS PI 7.0 STRL IVOR (GLOVE) ×3 IMPLANT
GOWN STRL REUS W/TWL LRG LVL3 (GOWN DISPOSABLE) ×3 IMPLANT
GOWN STRL REUS W/TWL XL LVL3 (GOWN DISPOSABLE) IMPLANT
GRASPER SUT TROCAR 14GX15 (MISCELLANEOUS) IMPLANT
KIT BASIN OR (CUSTOM PROCEDURE TRAY) ×3 IMPLANT
POUCH RETRIEVAL ECOSAC 10 (ENDOMECHANICALS) ×1 IMPLANT
POUCH RETRIEVAL ECOSAC 10MM (ENDOMECHANICALS) ×2
SCISSORS LAP 5X35 DISP (ENDOMECHANICALS) ×3 IMPLANT
SET IRRIG TUBING LAPAROSCOPIC (IRRIGATION / IRRIGATOR) ×3 IMPLANT
SLEEVE XCEL OPT CAN 5 100 (ENDOMECHANICALS) ×3 IMPLANT
STRIP CLOSURE SKIN 1/2X4 (GAUZE/BANDAGES/DRESSINGS) ×2 IMPLANT
SUT ETHILON 2 0 PS N (SUTURE) IMPLANT
SUT MNCRL AB 4-0 PS2 18 (SUTURE) ×3 IMPLANT
TOWEL OR 17X26 10 PK STRL BLUE (TOWEL DISPOSABLE) ×3 IMPLANT
TOWEL OR NON WOVEN STRL DISP B (DISPOSABLE) ×3 IMPLANT
TRAY LAPAROSCOPIC (CUSTOM PROCEDURE TRAY) ×3 IMPLANT
TROCAR BLADELESS OPT 5 100 (ENDOMECHANICALS) ×3 IMPLANT
TROCAR XCEL 12X100 BLDLESS (ENDOMECHANICALS) IMPLANT
TUBING INSUF HEATED (TUBING) ×3 IMPLANT

## 2017-10-31 NOTE — Anesthesia Preprocedure Evaluation (Addendum)
Anesthesia Evaluation  Patient identified by MRN, date of birth, ID band Patient awake    Reviewed: Allergy & Precautions, NPO status , Patient's Chart, lab work & pertinent test results  History of Anesthesia Complications Negative for: history of anesthetic complications  Airway Mallampati: II  TM Distance: >3 FB Neck ROM: Full    Dental  (+) Dental Advisory Given, Teeth Intact   Pulmonary neg pulmonary ROS,    breath sounds clear to auscultation       Cardiovascular negative cardio ROS   Rhythm:Regular Rate:Normal     Neuro/Psych negative neurological ROS  negative psych ROS   GI/Hepatic Neg liver ROS,  Appendicitis    Endo/Other  diabetes, Poorly Controlled, Type 1, Insulin Dependent  Renal/GU negative Renal ROS     Musculoskeletal negative musculoskeletal ROS (+)   Abdominal   Peds  Hematology negative hematology ROS (+)   Anesthesia Other Findings   Reproductive/Obstetrics                            Anesthesia Physical Anesthesia Plan  ASA: II  Anesthesia Plan: General   Post-op Pain Management:    Induction: Intravenous and Rapid sequence  PONV Risk Score and Plan: 4 or greater and Treatment may vary due to age or medical condition, Ondansetron, Midazolam, Scopolamine patch - Pre-op and Metaclopromide  Airway Management Planned: Oral ETT  Additional Equipment: None  Intra-op Plan:   Post-operative Plan: Extubation in OR  Informed Consent: I have reviewed the patients History and Physical, chart, labs and discussed the procedure including the risks, benefits and alternatives for the proposed anesthesia with the patient or authorized representative who has indicated his/her understanding and acceptance.   Dental advisory given  Plan Discussed with: CRNA and Anesthesiologist  Anesthesia Plan Comments:        Anesthesia Quick Evaluation

## 2017-10-31 NOTE — Discharge Summary (Signed)
Patient ID: Kelly Morrow [id number] [birth year] 22 y.o.  Admit date: 10/30/2017 Discharge date: 11/03/2017  Admitting Diagnosis: Acute appendicitis  Discharge Diagnosis Patient Active Problem List   Diagnosis Date Noted  . Poorly controlled type 1 diabetes mellitus (HCC) 10/30/2017  . Acute appendicitis 10/30/2017  . Insomnia 10/30/2017    Consultants none  Reason for Admission: Pleasant young female.  Is lived in [place] in [place].  Currently in [place].  Developed worsening abdominal pain over a day ago.  Rather diffuse with nausea and decreased appetite.  Became more intense.  Began vomiting.  Pain more intense and right lower side.  Painful to cough or move.  Car ride to the ER uncomfortable.  Examination concerning for appendicitis.  CT scan confirms large appendix.  No definite inflammation seen but extremely thin.  Rest of the differential diagnosis seems unlikely, surgical consultation requested.  She is an insulin requiring diabetic.  Followed by Dr. Sharl Ma.  On subcutaneous insulin.  Sugars tend to run in the 200s.  No personal nor family history of GI/colon cancer, inflammatory bowel disease, irritable bowel syndrome, allergy such as Celiac Sprue, dietary/dairy problems, colitis, ulcers nor gastritis.  No recent sick contacts/gastroenteritis.  No travel outside the country.  No changes in diet.  No dysphagia to solids or liquids.  No significant heartburn or reflux.  No hematochezia, hematemesis, coffee ground emesis.  No evidence of prior gastric/peptic ulceration.  Normally moves her bowels every day.  Does not smoke.  No prior abdominal surgery.  No history of kidney stones.  No PID.  No vaginal bleeding or discharge.  No pyuria.  No recent urinary tract infection.  Procedures Lap appy, Dr. Sheliah Hatch 10-18  Hospital Course:  The patient was admitted and underwent a laparoscopic appendectomy.  The patient tolerated the procedure well.  Pain control initially  difficult, but this did improve with time and multimodal therapies. On POD 3, the patient was tolerating a regular diet, voiding well, mobilizing, and pain was controlled with oral pain medications.  The patient was stable for DC home at this time with appropriate follow up made.      Physical Exam: Gen: Alert, NAD Cardio: RRR Pulm: CTAB, effort normal Abd: soft, ND, appropriately tender, +BS, multiple lap incisions cdi with steri strips and bandaids in place  Allergies as of 11/03/2017   No Known Allergies     Medication List    TAKE these medications   BASAGLAR KWIKPEN 100 UNIT/ML Sopn Inject 15 Units into the skin at bedtime.   escitalopram 5 MG tablet Commonly known as:  LEXAPRO Take 1 tablet by mouth daily.   glucagon 1 MG injection Inject 1 mg into the vein once as needed.   NOVOLOG FLEXPEN 100 UNIT/ML FlexPen Generic drug:  insulin aspart Inject 10-15 Units into the skin 3 (three) times daily with meals.   oxyCODONE 5 MG immediate release tablet Commonly known as:  Oxy IR/ROXICODONE Take 1 tablet (5 mg total) by mouth every 6 (six) hours as needed for severe pain.   traZODone 50 MG tablet Commonly known as:  DESYREL Take 1 tablet by mouth at bedtime.        Follow-up Information    [facility] Follow up on 11/18/2017.   Specialty:  General Surgery Why:  8:30am, arrive by 8:00am for paperwork and check in Contact information: [address] [place] [address] [phone]           Signed: Franne Forts,  [facility] 11/03/2017, 8:42 AM Pager: [phone] Mon 7:00 am -11:30 AM Tues-Fri 7:00 am-4:30 pm Sat-Sun 7:00 am-11:30 am

## 2017-10-31 NOTE — Discharge Instructions (Signed)
[facility] ° °Please arrive at least 30 min before your appointment to complete your check in paperwork.  If you are unable to arrive 30 min prior to your appointment time we may have to cancel or reschedule you. °LAPAROSCOPIC SURGERY: POST OP INSTRUCTIONS °Always review your discharge instruction sheet given to you by the facility where your surgery was performed. °IF YOU HAVE DISABILITY OR FAMILY LEAVE FORMS, YOU MUST BRING THEM TO THE OFFICE FOR PROCESSING.   °DO NOT GIVE THEM TO YOUR DOCTOR. ° °PAIN CONTROL ° °1. First take acetaminophen (Tylenol) AND/or ibuprofen (Advil) to control your pain after surgery.  Follow directions on package.  Taking acetaminophen (Tylenol) and/or ibuprofen (Advil) regularly after surgery will help to control your pain and lower the amount of prescription pain medication you may need.  You should not take more than 4,000 mg (4 grams) of acetaminophen (Tylenol) in 24 hours.  You should not take ibuprofen (Advil), aleve, motrin, naprosyn or other NSAIDS if you have a history of stomach ulcers or chronic kidney disease.  °2. A prescription for pain medication may be given to you upon discharge.  Take your pain medication as prescribed, if you still have uncontrolled pain after taking acetaminophen (Tylenol) or ibuprofen (Advil). °3. Use ice packs to help control pain. °4. If you need a refill on your pain medication, please contact your pharmacy.  They will contact our office to request authorization. Prescriptions will not be filled after 5pm or on week-ends. ° °HOME MEDICATIONS °5. Take your usually prescribed medications unless otherwise directed. ° °DIET °6. You should follow a light diet the first few days after arrival home.  Be sure to include lots of fluids daily. Avoid fatty, fried foods.  ° °CONSTIPATION °7. It is common to experience some constipation after surgery and if you are taking pain medication.  Increasing fluid intake and taking a stool  softener (such as Colace) will usually help or prevent this problem from occurring.  A mild laxative (Milk of Magnesia or Miralax) should be taken according to package instructions if there are no bowel movements after 48 hours. ° °WOUND/INCISION CARE °8. Most patients will experience some swelling and bruising in the area of the incisions.  Ice packs will help.  Swelling and bruising can take several days to resolve.  °9. Unless discharge instructions indicate otherwise, follow guidelines below  °a. STERI-STRIPS - you may remove your outer bandages 48 hours after surgery, and you may shower at that time.  You have steri-strips (small skin tapes) in place directly over the incision.  These strips should be left on the skin for 7-10 days.   °b. DERMABOND/SKIN GLUE - you may shower in 24 hours.  The glue will flake off over the next 2-3 weeks. °10. Any sutures or staples will be removed at the office during your follow-up visit. ° °ACTIVITIES °11. You may resume regular (light) daily activities beginning the next day--such as daily self-care, walking, climbing stairs--gradually increasing activities as tolerated.  You may have sexual intercourse when it is comfortable.  Refrain from any heavy lifting or straining until approved by your doctor. °a. You may drive when you are no longer taking prescription pain medication, you can comfortably wear a seatbelt, and you can safely maneuver your car and apply brakes. ° °FOLLOW-UP °12. You should see your doctor in the office for a follow-up appointment approximately 2-3 weeks after your surgery.  You should have been given your post-op/follow-up appointment when   your surgery was scheduled.  If you did not receive a post-op/follow-up appointment, make sure that you call for this appointment within a day or two after you arrive home to insure a convenient appointment time. ° ° °WHEN TO CALL YOUR DOCTOR: °1. Fever over 101.0 °2. Inability to urinate °3. Continued bleeding from  incision. °4. Increased pain, redness, or drainage from the incision. °5. Increasing abdominal pain ° °The clinic staff is available to answer your questions during regular business hours.  Please don’t hesitate to call and ask to speak to one of the nurses for clinical concerns.  If you have a medical emergency, go to the nearest emergency room or call 911.  A surgeon from [facility] is always on call at the hospital. °[address], [address], [place], [place]  [address] ? [address] [address], [place], [place]   [address] °[phone] ? [phone] ? FAX [phone] ° °

## 2017-10-31 NOTE — Progress Notes (Addendum)
Inpatient Diabetes Program Recommendations  AACE/ADA: New Consensus Statement on Inpatient Glycemic Control (2015)  Target Ranges:  Prepandial:   less than 140 mg/dL      Peak postprandial:   less than 180 mg/dL (1-2 hours)      Critically ill patients:  140 - 180 mg/dL   Lab Results  Component Value Date   GLUCAP 149 (H) 10/31/2017   HGBA1C 10.2 (H) 10/31/2017    Review of Glycemic ControlResults for Kelly, Morrow (MRN [id number]) as of 10/31/2017 11:11  Ref. Range 10/30/2017 17:20 10/30/2017 21:32 10/30/2017 23:27 10/31/2017 04:14 10/31/2017 07:25  Glucose-Capillary Latest Ref Range: 70 - 99 mg/dL 782 (H) 956 (H) 213 (H) 79 149 (H)    Diabetes history: DM 1- see's Dr. Sharl Ma Outpatient Diabetes medications:  Basaglar 15 units q HS, Novolog 10-15 units tid with meals Current orders for Inpatient glycemic control:  Novolog moderate q 4 hours, Lantus 15 units q HS Inpatient Diabetes Program Recommendations:   Please consider reducing Novolog correction to sensitive q 4 hours. Once eating she will also need Novolog meal coverage.   Thanks,  Beryl Meager, RN, BC-ADM Inpatient Diabetes Coordinator Pager [phone] (8a-5p)

## 2017-10-31 NOTE — Progress Notes (Signed)
Patient requesting to take her sliding scale coverage plus 4 extra units as her mom brought her cheesecake. States this is what she does at home.  Have order-  "Patient may use medications from home as long as nursing supervises / documents its use (as you normally do...)"  Dr. Karie Soda, MD 10/30/17 2151  Patient's CBG was 244; will receive 5 units sliding scale plus 4 additional units per patient.  Will continue to monitor.

## 2017-10-31 NOTE — Anesthesia Procedure Notes (Signed)
Procedure Name: Intubation Date/Time: 10/31/2017 11:43 AM Performed by: Mitzie Na, CRNA Pre-anesthesia Checklist: Patient identified, Emergency Drugs available, Suction available, Patient being monitored and Timeout performed Patient Re-evaluated:Patient Re-evaluated prior to induction Oxygen Delivery Method: Circle system utilized Preoxygenation: Pre-oxygenation with 100% oxygen Induction Type: IV induction Ventilation: Mask ventilation without difficulty Laryngoscope Size: Mac and 3 Grade View: Grade I Tube type: Oral Tube size: 7.0 mm Number of attempts: 1 Airway Equipment and Method: Stylet Placement Confirmation: ETT inserted through vocal cords under direct vision,  positive ETCO2 and breath sounds checked- equal and bilateral Secured at: 22 cm Tube secured with: Tape Dental Injury: Teeth and Oropharynx as per pre-operative assessment

## 2017-10-31 NOTE — Progress Notes (Signed)
Pre Procedure note for inpatients:   Kelly Morrow has been scheduled for Procedure(s): APPENDECTOMY LAPAROSCOPIC (N/A) today. The various methods of treatment have been discussed with the patient. After consideration of the risks, benefits and treatment options the patient has consented to the planned procedure.   The patient has been seen and labs reviewed. There are no changes in the patient's condition to prevent proceeding with the planned procedure today.  Recent labs:  Lab Results  Component Value Date   WBC 6.2 10/30/2017   HGB 16.0 (H) 10/30/2017   HCT 48.2 (H) 10/30/2017   PLT 271 10/30/2017   GLUCOSE 207 (H) 10/30/2017   ALT 16 10/30/2017   AST 16 10/30/2017   NA 137 10/30/2017   K 3.8 10/30/2017   CL 102 10/30/2017   CREATININE 0.78 10/30/2017   BUN 15 10/30/2017   CO2 22 10/30/2017   HGBA1C 10.2 (H) 10/31/2017    Rodman Pickle, MD 10/31/2017 8:50 AM

## 2017-10-31 NOTE — Op Note (Signed)
Preoperative diagnosis: acute appendicitis  Postoperative diagnosis: Same   Procedure: laparoscopic appendectomy  Surgeon: Feliciana Rossetti, M.D.  Asst: Liana Crocker PA student  Anesthesia: Gen.   Indications for procedure: Kelly Morrow is a 22 y.o. female with symptoms of pain in right lower quadrant and nausea consistent with acute appendicitis. Confirmed by CT scan and laboratory values.  Description of procedure: The patient was brought into the operative suite, placed supine. Anesthesia was administered with endotracheal tube. The patient's left arm was tucked. All pressure points were offloaded by foam padding. The patient was prepped and draped in the usual sterile fashion.  A transverse incision was made to the left of the umbilicus and a 5mm trocar was [place]. Pneumoperitoneum was applied with high flow low pressure.  2 5mm trocars were placed, one in the suprapubic space, one in the LLQ, the periumbilical incision was then up-sized and a 11mm trocar placed in that space. A transversus abdominal block was placed on the left and right sides. Next, the patient was placed in trendelenberg, rotated to the left. The omentum was retracted cephalad. The cecum and appendix were identified. The appendix was dilated and inflamed but no perforated. There was no fibrinous debris or fluid around the area. The base of the appendix was dissected and a window through the mesoappendix was created with blunt dissection. Large Hem-o-lock clips were used to doubly ligate the base of the appendix and mesoappendix. The appendix was cut free with scissors.  The appendix was placed in a specimen bag. The pelvis and RLQ were irrigated. Both ovaries were identified and appeared normal. No purulent fluid was in the pouch of [place]. The appendix was removed via the umbilicus. 0 vicryl was used to close the fascial defect. Pneumoperitoneum was removed, all trocars were removed. All incisions were closed with 4-0  monocryl subcuticular stitch. The patient woke from anesthesia and was brought to PACU in stable condition.  Findings: acute appendicitis without perforation  Specimen: appendix  Blood loss: 30 ml  Local anesthesia: 20 ml marcaine  Complications: none  Images:     Feliciana Rossetti, M.D. General, Bariatric, & Minimally Invasive Surgery [facility]

## 2017-10-31 NOTE — Transfer of Care (Signed)
Immediate Anesthesia Transfer of Care Note  Patient: Kelly Morrow  Procedure(s) Performed: APPENDECTOMY LAPAROSCOPIC (N/A Abdomen)  Patient Location: PACU  Anesthesia Type:General  Level of Consciousness: awake, alert , oriented and patient cooperative  Airway & Oxygen Therapy: Patient Spontanous Breathing and Patient connected to face mask oxygen  Post-op Assessment: Report given to RN, Post -op Vital signs reviewed and stable and Patient moving all extremities  Post vital signs: Reviewed and stable  Last Vitals:  Vitals Value Taken Time  BP 134/97 10/31/2017 12:52 PM  Temp    Pulse 88 10/31/2017 12:55 PM  Resp 19 10/31/2017 12:55 PM  SpO2 100 % 10/31/2017 12:55 PM  Vitals shown include unvalidated device data.  Last Pain:  Vitals:   10/31/17 0710  TempSrc:   PainSc: Asleep         Complications: No apparent anesthesia complications

## 2017-11-01 LAB — GLUCOSE, CAPILLARY
GLUCOSE-CAPILLARY: 193 mg/dL — AB (ref 70–99)
GLUCOSE-CAPILLARY: 90 mg/dL (ref 70–99)
Glucose-Capillary: 159 mg/dL — ABNORMAL HIGH (ref 70–99)
Glucose-Capillary: 170 mg/dL — ABNORMAL HIGH (ref 70–99)
Glucose-Capillary: 63 mg/dL — ABNORMAL LOW (ref 70–99)
Glucose-Capillary: 70 mg/dL (ref 70–99)
Glucose-Capillary: 89 mg/dL (ref 70–99)

## 2017-11-01 MED ORDER — HYDROMORPHONE HCL 1 MG/ML IJ SOLN
0.5000 mg | INTRAMUSCULAR | Status: DC | PRN
  Administered 2017-11-01 – 2017-11-02 (×4): 0.5 mg via INTRAVENOUS
  Filled 2017-11-01 (×4): qty 0.5

## 2017-11-01 MED ORDER — KETOROLAC TROMETHAMINE 15 MG/ML IJ SOLN
15.0000 mg | Freq: Three times a day (TID) | INTRAMUSCULAR | Status: DC
  Administered 2017-11-01 – 2017-11-03 (×6): 15 mg via INTRAVENOUS
  Filled 2017-11-01 (×6): qty 1

## 2017-11-01 MED ORDER — INSULIN ASPART 100 UNIT/ML ~~LOC~~ SOLN
10.0000 [IU] | Freq: Three times a day (TID) | SUBCUTANEOUS | Status: DC
  Administered 2017-11-01 – 2017-11-02 (×2): 10 [IU] via SUBCUTANEOUS

## 2017-11-01 NOTE — Progress Notes (Signed)
[facility] Progress Note  1 Day Post-Op  Subjective: S/p Laparoscopic appendectomy POD 1. Patient reports doing "okay" overall. She is "lightheaded and shaky" this morning and feels this may be due to the pain medicine. She reports soreness at an incision site in the LLQ, and rates overall pain at 5/10 this AM. She also reports pain prior to urination. She denies dysuria, but feels a "sharp pain at the urethra" and has to strain to pass urine. She had mashed potatoes and pudding last night for dinner. She denies hematuria, NVD.   Objective: Vital signs in last 24 hours: Temp:  [97.6 F (36.4 C)-98.3 F (36.8 C)] 98 F (36.7 C) (10/19 0545) Pulse Rate:  [56-88] 56 (10/19 0545) Resp:  [16-19] 16 (10/19 0545) BP: (94-134)/(45-97) 98/45 (10/19 0545) SpO2:  [100 %] 100 % (10/19 0545) Last BM Date: 10/30/17  Intake/Output from previous day: 10/18 0701 - 10/19 0700 In: 3890.9 [P.O.:1320; I.V.:2510; IV Piggyback:60.9] Out: 4010 [Urine:4000; Blood:10] Intake/Output this shift: No intake/output data recorded.  PE: Gen:  Alert, NAD, pleasant Card:  Regular rate and rhythm Pulm:  Normal effort, clear to auscultation bilaterally Abd: Soft, TTP lower midline and LLQ, non-distended, bowel sounds present in all 4 quadrants, incisions C/D/I Skin: warm and dry, no rashes  Psych: A&Ox3   Lab Results:  Recent Labs    10/30/17 1720  WBC 6.2  HGB 16.0*  HCT 48.2*  PLT 271   BMET Recent Labs    10/30/17 1515  NA 137  K 3.8  CL 102  CO2 22  GLUCOSE 207*  BUN 15  CREATININE 0.78  CALCIUM 9.6   PT/INR No results for input(s): LABPROT, INR in the last 72 hours. CMP     Component Value Date/Time   NA 137 10/30/2017 1515   K 3.8 10/30/2017 1515   CL 102 10/30/2017 1515   CO2 22 10/30/2017 1515   GLUCOSE 207 (H) 10/30/2017 1515   BUN 15 10/30/2017 1515   CREATININE 0.78 10/30/2017 1515   CALCIUM 9.6 10/30/2017 1515   PROT 8.4 (H) 10/30/2017 1515   ALBUMIN 4.8  10/30/2017 1515   AST 16 10/30/2017 1515   ALT 16 10/30/2017 1515   ALKPHOS 69 10/30/2017 1515   BILITOT 1.0 10/30/2017 1515   GFRNONAA >60 10/30/2017 1515   GFRAA >60 10/30/2017 1515   Lipase     Component Value Date/Time   LIPASE 34 10/30/2017 1515       Studies/Results: Ct Abdomen Pelvis W Contrast  Result Date: 10/30/2017 CLINICAL DATA:  Right lower quadrant pain beginning yesterday. Type 1 diabetes. EXAM: CT ABDOMEN AND PELVIS WITH CONTRAST TECHNIQUE: Multidetector CT imaging of the abdomen and pelvis was performed using the standard protocol following bolus administration of intravenous contrast. CONTRAST:  ISOVUE-300 IOPAMIDOL (ISOVUE-300) INJECTION 61%, 30mL OMNIPAQUE IOHEXOL 300 MG/ML SOLN COMPARISON:  None. FINDINGS: Lower chest: Normal Hepatobiliary: Normal Pancreas: Normal Spleen: Normal Adrenals/Urinary Tract: Adrenal glands are normal. Kidneys are normal. Bladder is normal. Stomach/Bowel: The appendix is more prominent than usual, measuring up to 1 cm in diameter. There is not any convincing inflammatory change around the fat however. I note that the white count is also normal. Given the presence of right lower quadrant pain and appendix of this size, this could be a case of early appendicitis. I do not think the diagnosis is definite however. Vascular/Lymphatic: Normal Reproductive: Normal Other: None Musculoskeletal: Normal IMPRESSION: The appendix is more prominent than usual, measuring up to 1 cm in  size. It does not fill with contrast. There is not any gross surrounding inflammatory change however. The patient does have right lower quadrant pain. The white count is normal. This case remains indeterminate by imaging, and the possibility of very early appendicitis is not excluded. At the least, clinical follow-up is suggested. Electronically Signed   By: Paulina Fusi M.D.   On: 10/30/2017 20:15    Anti-infectives: Anti-infectives (From admission, onward)   Start      Dose/Rate Route Frequency Ordered Stop   10/30/17 2230  cefTRIAXone (ROCEPHIN) 2 g in sodium chloride 0.9 % 100 mL IVPB  Status:  Discontinued     2 g 200 mL/hr over 30 Minutes Intravenous Every 24 hours 10/30/17 2151 10/31/17 1343   10/30/17 2230  metroNIDAZOLE (FLAGYL) IVPB 500 mg  Status:  Discontinued     500 mg 100 mL/hr over 60 Minutes Intravenous Every 8 hours 10/30/17 2151 10/31/17 1343       Assessment/Plan -S/p successful laparoscopic appendectomy for acute appendicitis POD1 -Pt doing well overall. Her pain is appropriate given her procedure, she denies NV and ambulates without difficulty. Her incision sites are all clean, dry and intact.  -Pt reports pain prior to urination that she feels is in her urethra. She denies any symptoms of dysuria. This most likely a side effect from surgery and should resolve over the next couple of days.   -Continue fluids and IV abx at this time -Continue pain meds prn -Anticipate d/c today if pt continues to improve.  -I have discussed the importance of ambulation and advised her to ease back slowly into her regular routine over the next couple of weeks. She should avoid lifting anything over 15-20lbs for the next few weeks.  -Plan to f/u with her in office in 3-4 weeks. She should come in sooner if she notices any redness/inflammation at her incision sites, if she develops a fever or has any NV.    LOS: 1 day    Liana Crocker, PA-S

## 2017-11-02 DIAGNOSIS — G9340 Encephalopathy, unspecified: Secondary | ICD-10-CM | POA: Diagnosis present

## 2017-11-02 LAB — CBC WITH DIFFERENTIAL/PLATELET
ABS IMMATURE GRANULOCYTES: 0 10*3/uL (ref 0.00–0.07)
BASOS PCT: 0 %
Basophils Absolute: 0 10*3/uL (ref 0.0–0.1)
Eosinophils Absolute: 0 10*3/uL (ref 0.0–0.5)
Eosinophils Relative: 0 %
HEMATOCRIT: 39.6 % (ref 36.0–46.0)
HEMOGLOBIN: 12.9 g/dL (ref 12.0–15.0)
Immature Granulocytes: 0 %
LYMPHS PCT: 59 %
Lymphs Abs: 2 10*3/uL (ref 0.7–4.0)
MCH: 29.7 pg (ref 26.0–34.0)
MCHC: 32.6 g/dL (ref 30.0–36.0)
MCV: 91.2 fL (ref 80.0–100.0)
MONO ABS: 0.3 10*3/uL (ref 0.1–1.0)
MONOS PCT: 7 %
NEUTROS ABS: 1.2 10*3/uL — AB (ref 1.7–7.7)
Neutrophils Relative %: 34 %
Platelets: 229 10*3/uL (ref 150–400)
RBC: 4.34 MIL/uL (ref 3.87–5.11)
RDW: 11.8 % (ref 11.5–15.5)
WBC: 3.5 10*3/uL — ABNORMAL LOW (ref 4.0–10.5)
nRBC: 0 % (ref 0.0–0.2)

## 2017-11-02 LAB — BASIC METABOLIC PANEL
Anion gap: 8 (ref 5–15)
BUN: 7 mg/dL (ref 6–20)
CHLORIDE: 103 mmol/L (ref 98–111)
CO2: 31 mmol/L (ref 22–32)
Calcium: 9 mg/dL (ref 8.9–10.3)
Creatinine, Ser: 0.76 mg/dL (ref 0.44–1.00)
GFR calc Af Amer: >60 mL/min (ref >60)
GFR calc non Af Amer: >60 mL/min (ref >60)
GLUCOSE: 162 mg/dL — AB (ref 70–99)
POTASSIUM: 3.1 mmol/L — AB (ref 3.5–5.1)
Sodium: 142 mmol/L (ref 135–145)

## 2017-11-02 LAB — GLUCOSE, CAPILLARY
GLUCOSE-CAPILLARY: 49 mg/dL — AB (ref 70–99)
GLUCOSE-CAPILLARY: 134 mg/dL — AB (ref 70–99)
GLUCOSE-CAPILLARY: 150 mg/dL — AB (ref 70–99)
GLUCOSE-CAPILLARY: 169 mg/dL — AB (ref 70–99)
Glucose-Capillary: 140 mg/dL — ABNORMAL HIGH (ref 70–99)
Glucose-Capillary: 164 mg/dL — ABNORMAL HIGH (ref 70–99)
Glucose-Capillary: 128 mg/dL — ABNORMAL HIGH (ref 70–99)
Glucose-Capillary: 46 mg/dL — ABNORMAL LOW (ref 70–99)
Glucose-Capillary: 67 mg/dL — ABNORMAL LOW (ref 70–99)
Glucose-Capillary: 213 mg/dL — ABNORMAL HIGH (ref 70–99)

## 2017-11-02 NOTE — Progress Notes (Signed)
  Progress Note: General Surgery Service   Assessment/Plan: Principal Problem:   Acute appendicitis Active Problems:   Poorly controlled type 1 diabetes mellitus (HCC)   Insomnia  s/p Procedure(s): APPENDECTOMY LAPAROSCOPIC 10/31/2017 -continued pain and weakness    LOS: 1 day  Chief Complaint/Subjective: Continued pain in abdomen and pain in urethral area. Knees buckled yesterday when walking  Objective: Vital signs in last 24 hours: Temp:  [97.7 F (36.5 C)-97.9 F (36.6 C)] 97.9 F (36.6 C) (10/20 0603) Pulse Rate:  [56-71] 59 (10/20 0603) Resp:  [16-20] 16 (10/20 0603) BP: (111-134)/(74-94) 114/82 (10/20 0603) SpO2:  [100 %] 100 % (10/20 0603) Last BM Date: 10/31/17  Intake/Output from previous day: 10/19 0701 - 10/20 0700 In: 3172.3 [P.O.:840; I.V.:2282.3; IV Piggyback:50] Out: 4800 [Urine:4800] Intake/Output this shift: No intake/output data recorded.  Lungs: nonlabored breathing  Cardiovascular: bradycardic  Abd: soft, ATTP, incisions c/d/i  Extremities: no edema  Neuro: AOx4  Lab Results: CBC  Recent Labs    10/30/17 1720  WBC 6.2  HGB 16.0*  HCT 48.2*  PLT 271   BMET Recent Labs    10/30/17 1515  NA 137  K 3.8  CL 102  CO2 22  GLUCOSE 207*  BUN 15  CREATININE 0.78  CALCIUM 9.6   PT/INR No results for input(s): LABPROT, INR in the last 72 hours. ABG No results for input(s): PHART, HCO3 in the last 72 hours.  Invalid input(s): PCO2, PO2  Studies/Results:  Anti-infectives: Anti-infectives (From admission, onward)   Start     Dose/Rate Route Frequency Ordered Stop   10/30/17 2230  cefTRIAXone (ROCEPHIN) 2 g in sodium chloride 0.9 % 100 mL IVPB  Status:  Discontinued     2 g 200 mL/hr over 30 Minutes Intravenous Every 24 hours 10/30/17 2151 10/31/17 1343   10/30/17 2230  metroNIDAZOLE (FLAGYL) IVPB 500 mg  Status:  Discontinued     500 mg 100 mL/hr over 60 Minutes Intravenous Every 8 hours 10/30/17 2151 10/31/17 1343       Medications: Scheduled Meds: . enoxaparin (LOVENOX) injection  40 mg Subcutaneous QHS  . escitalopram  5 mg Oral Daily  . insulin aspart  0-15 Units Subcutaneous Q4H  . insulin aspart  10 Units Subcutaneous TID WC  . insulin glargine  15 Units Subcutaneous QHS  . ketorolac  15 mg Intravenous Q8H  . lip balm  1 application Topical BID  . traZODone  50 mg Oral QHS   Continuous Infusions: . lactated ringers 100 mL/hr at 11/02/17 0600  . methocarbamol (ROBAXIN) IV Stopped (11/01/17 2316)  . ondansetron (ZOFRAN) IV    . sodium chloride     PRN Meds:.acetaminophen **OR** acetaminophen, alum & mag hydroxide-simeth, diphenhydrAMINE **OR** diphenhydrAMINE, guaiFENesin-dextromethorphan, hydrocortisone, hydrocortisone cream, HYDROmorphone (DILAUDID) injection, magic mouthwash, menthol-cetylpyridinium, methocarbamol (ROBAXIN) IV, ondansetron (ZOFRAN) IV **OR** ondansetron (ZOFRAN) IV, ondansetron **OR** ondansetron (ZOFRAN) IV, phenol, prochlorperazine, simethicone, sodium chloride  Rodman Pickle, MD Pg# [phone] [facility]

## 2017-11-03 ENCOUNTER — Encounter (HOSPITAL_COMMUNITY): Payer: Self-pay | Admitting: General Surgery

## 2017-11-03 LAB — CBC
HCT: 37.7 % (ref 36.0–46.0)
HEMOGLOBIN: 12.4 g/dL (ref 12.0–15.0)
MCH: 29.8 pg (ref 26.0–34.0)
MCHC: 32.9 g/dL (ref 30.0–36.0)
MCV: 90.6 fL (ref 80.0–100.0)
Platelets: 206 10*3/uL (ref 150–400)
RBC: 4.16 MIL/uL (ref 3.87–5.11)
RDW: 11.6 % (ref 11.5–15.5)
WBC: 4.5 10*3/uL (ref 4.0–10.5)
nRBC: 0 % (ref 0.0–0.2)

## 2017-11-03 LAB — GLUCOSE, CAPILLARY
Glucose-Capillary: 123 mg/dL — ABNORMAL HIGH (ref 70–99)
Glucose-Capillary: 82 mg/dL (ref 70–99)
Glucose-Capillary: 236 mg/dL — ABNORMAL HIGH (ref 70–99)

## 2017-11-03 MED ORDER — HYDROMORPHONE HCL 1 MG/ML IJ SOLN: 0.5000 mg | INTRAMUSCULAR | Status: DC | PRN

## 2017-11-03 MED ORDER — OXYCODONE HCL 5 MG PO TABS: 5.0000 mg | ORAL_TABLET | Freq: Four times a day (QID) | ORAL | 0 refills | Status: AC | PRN

## 2017-11-03 MED ORDER — OXYCODONE HCL 5 MG PO TABS: 5.0000 mg | ORAL_TABLET | Freq: Four times a day (QID) | ORAL | Status: DC | PRN

## 2017-11-03 NOTE — Progress Notes (Signed)
Inpatient Diabetes Program Recommendations  AACE/ADA: New Consensus Statement on Inpatient Glycemic Control (2015)  Target Ranges:  Prepandial:   less than 140 mg/dL      Peak postprandial:   less than 180 mg/dL (1-2 hours)      Critically ill patients:  140 - 180 mg/dL   Lab Results  Component Value Date   GLUCAP 236 (H) 11/03/2017   HGBA1C 10.2 (H) 10/31/2017    Review of Glycemic Control  Diabetes history: DM1 Outpatient Diabetes medications: Basaglar 15 units QHS, Novolog 0-1  Current orders for Inpatient glycemic control: Lantus 15 units QHS, Novolog 0-15 units Q4H + 10 units tidwc.  HgbA1C - 10.2% - uncontrolled  Inpatient Diabetes Program Recommendations:    To be discharged on:  Basaglar 15 units QHS Novolog 10-15 units tidwc  Spoke with pt and mother at length regarding concerns over blood sugar management while inpatient. Pt is Type 1 and very sensitive to insulin. Pt and Mom concerned about staff confusing Type 1 DM with Type 2. Answered questions and listened to concerns. Hypoglycemia at 0400 on 10/20. Had 46 and 67 prior to lunch meal on 10/20, likely d/t Novolog dose.   To be discharged home. Discussed HgbA1C of 10.2% and importance of tighter glycemic control to prevent long-term complications. Answered questions.  Thank you. Ailene Ards, RD, LDN, CDE Inpatient Diabetes Coordinator [phone]

## 2017-11-09 NOTE — Anesthesia Postprocedure Evaluation (Signed)
Anesthesia Post Note  Patient: Kelly Morrow  Procedure(s) Performed: APPENDECTOMY LAPAROSCOPIC (N/A Abdomen)     Patient location during evaluation: PACU Anesthesia Type: General Level of consciousness: awake and alert Pain management: pain level controlled Vital Signs Assessment: post-procedure vital signs reviewed and stable Respiratory status: spontaneous breathing, nonlabored ventilation and respiratory function stable Cardiovascular status: blood pressure returned to baseline and stable Postop Assessment: no apparent nausea or vomiting Anesthetic complications: no    Last Vitals:  Vitals:   11/02/17 2332 11/03/17 0654  BP: 137/82 118/83  Pulse: (!) 55 60  Resp: 16 15  Temp: 36.6 C 36.9 C  SpO2: 100% 100%    Last Pain:  Vitals:   11/03/17 1012  TempSrc:   PainSc: 2                  Beryle Lathe

## 2019-05-30 IMAGING — CT CT ABD-PELV W/ CM
2 of 4 series · 16 of 46 positions shown, 18 images · IV contrast (iopamidol)
Comparison: None.

CLINICAL DATA: Right lower quadrant pain beginning yesterday. Type
1 diabetes.

EXAM:
CT ABDOMEN AND PELVIS WITH CONTRAST
TECHNIQUE: Multidetector CT imaging of the abdomen and pelvis was performed
using the standard protocol following bolus administration of
intravenous contrast.
CONTRAST:  100mL Q45QHG-EZZ IOPAMIDOL (Q45QHG-EZZ) INJECTION 61%,
30mL OMNIPAQUE IOHEXOL 300 MG/ML SOLN

[Series 2: axial st · axial · 0.70mm/px · z∈[+607,+992]mm · 13 of 87 slices shown, 15 images]
[im 5/87  soft-tissue]
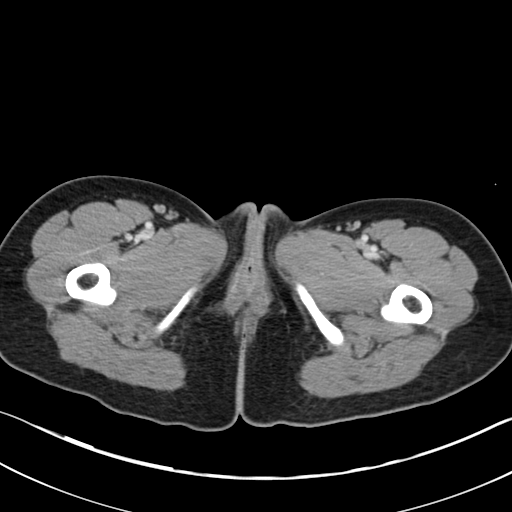
[im 5/87  bone]
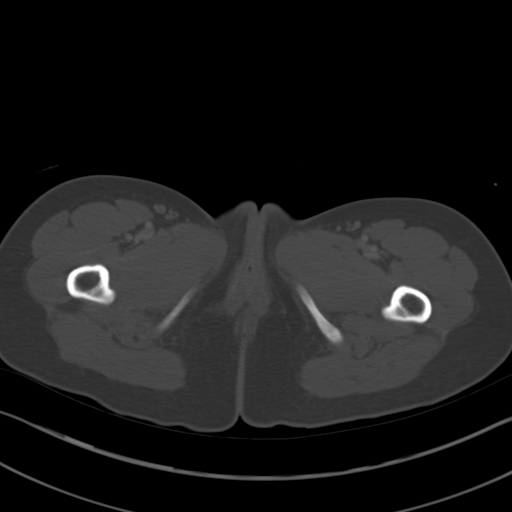
[im 10/87  soft-tissue]
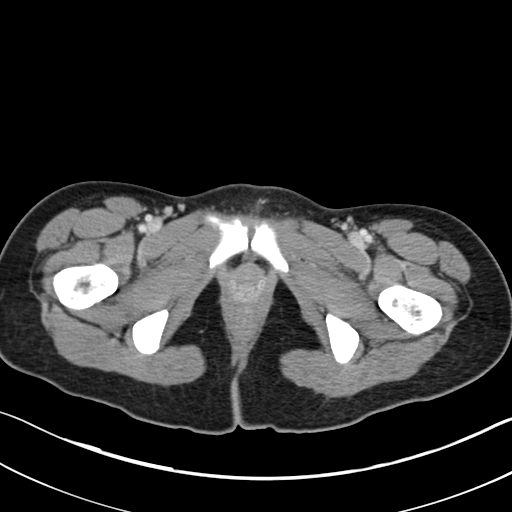
[im 20/87  soft-tissue]
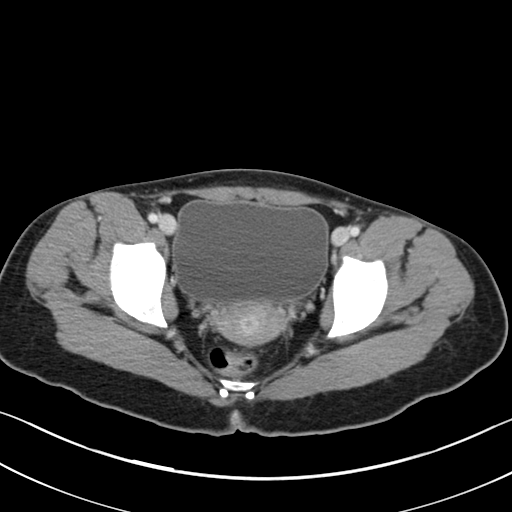
[im 24/87  soft-tissue]
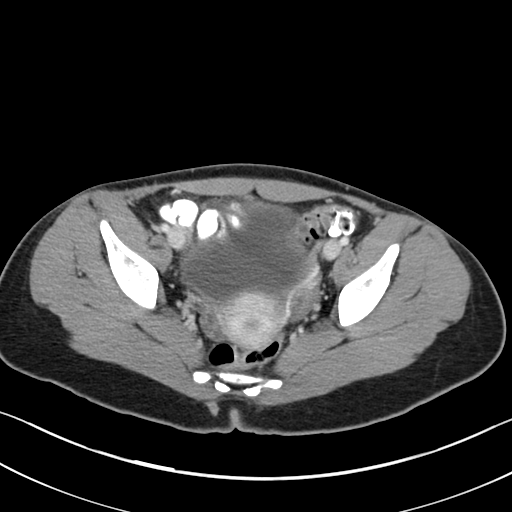
[im 29/87  soft-tissue]
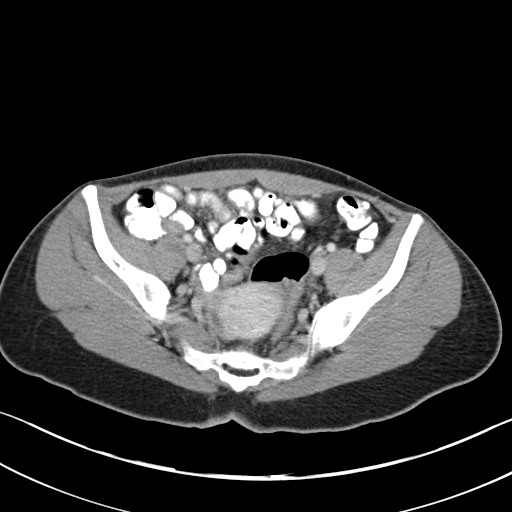
[im 39/87  soft-tissue]
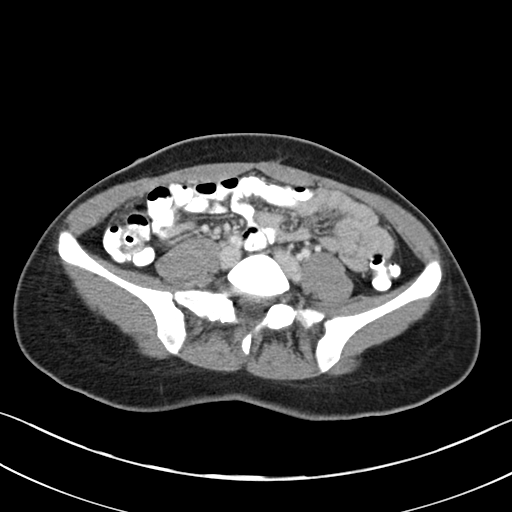
[im 44/87  soft-tissue]
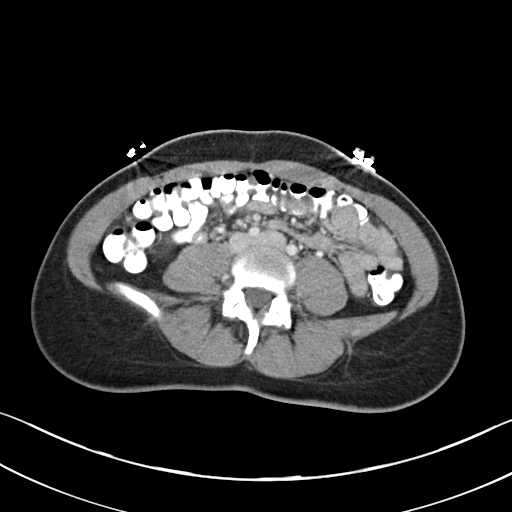
[im 48/87  soft-tissue]
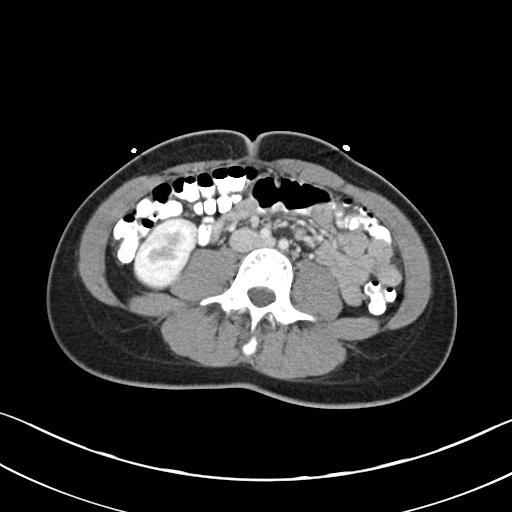
[im 58/87  soft-tissue]
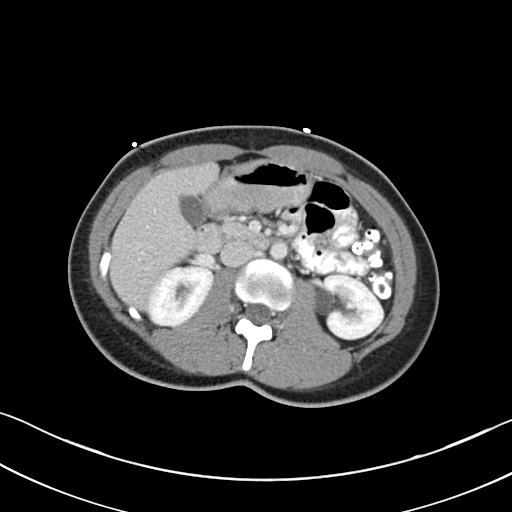
[im 58/87  bone]
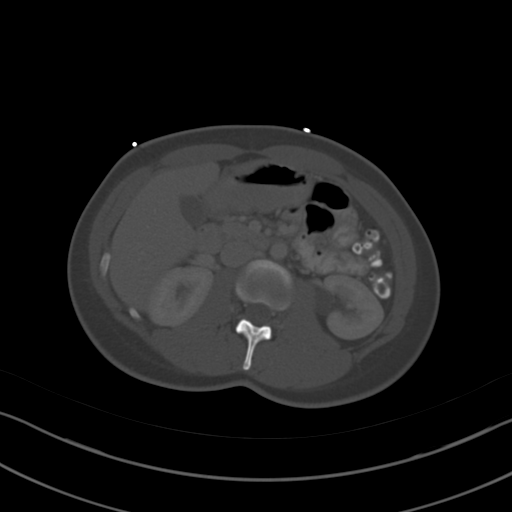
[im 63/87  soft-tissue]
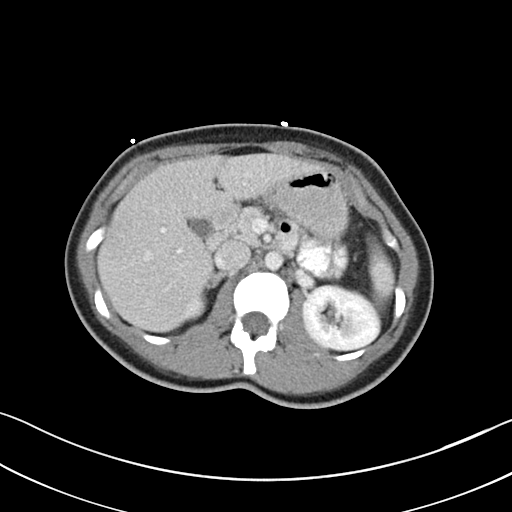
[im 67/87  soft-tissue]
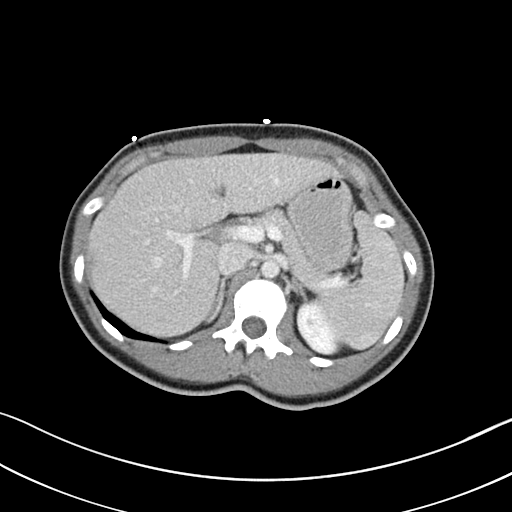
[im 77/87  soft-tissue]
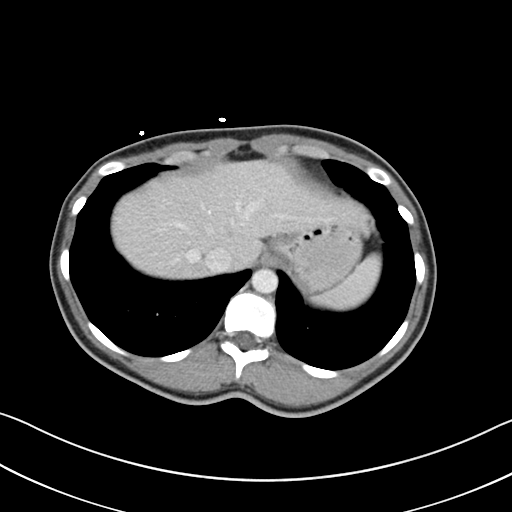
[im 82/87  soft-tissue]
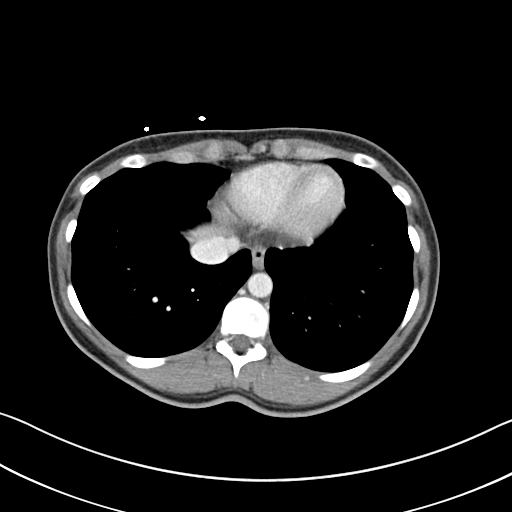

[Series 5: coronal st · coronal · 0.68mm/px · 3 of 74 slices shown]
[im 25/74  soft-tissue]
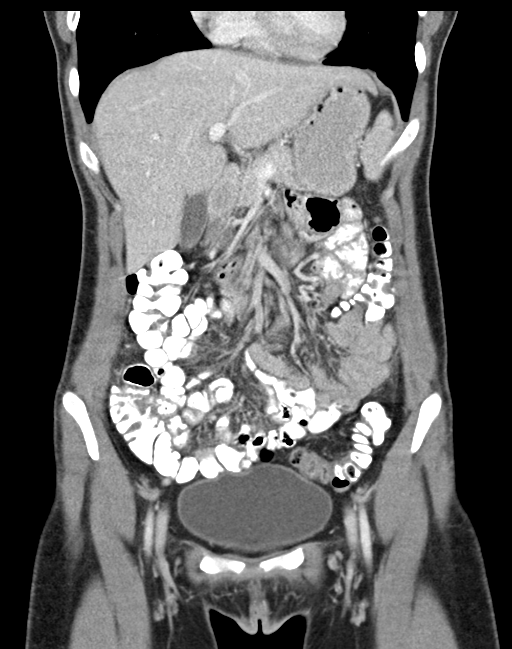
[im 33/74  soft-tissue]
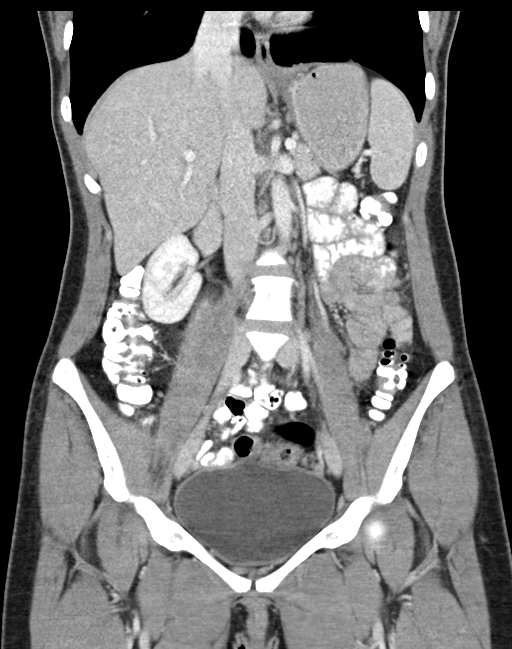
[im 41/74  soft-tissue]
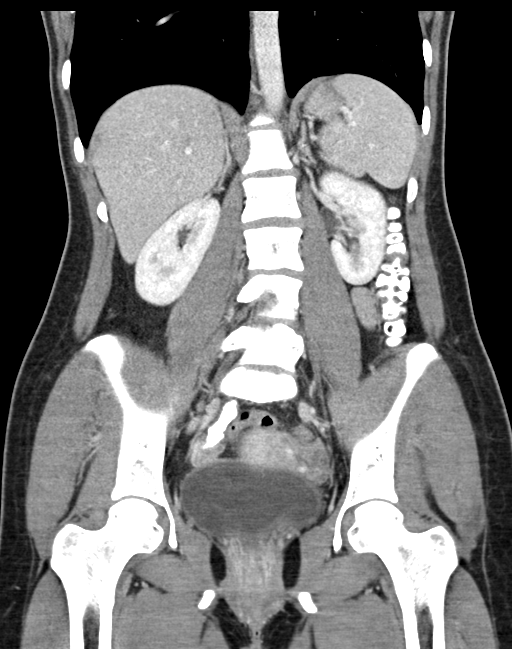

[16 of 46 positions shown; findings below may reference images not displayed]

FINDINGS: Lower chest: Normal

Hepatobiliary: Normal

Pancreas: Normal

Spleen: Normal

Adrenals/Urinary Tract: Adrenal glands are normal. Kidneys are
normal. Bladder is normal.

Stomach/Bowel: The appendix is more prominent than usual, measuring
up to 1 cm in diameter. There is not any convincing inflammatory
change around the fat however. I note that the white count is also
normal. Given the presence of right lower quadrant pain and appendix
of this size, this could be a case of early appendicitis. I do not
think the diagnosis is definite however.

Vascular/Lymphatic: Normal

Reproductive: Normal

Other: None

Musculoskeletal: Normal
IMPRESSION: The appendix is more prominent than usual, measuring up to 1 cm in
size. It does not fill with contrast. There is not any gross
surrounding inflammatory change however. The patient does have right
lower quadrant pain. The white count is normal. This case remains
indeterminate by imaging, and the possibility of very early
appendicitis is not excluded. At the least, clinical follow-up is
suggested.
